# Patient Record
Sex: Female | Born: 1985
Health system: Southern US, Community
[De-identification: ages and names within clinical notes are randomized; demographics above are authoritative.]

## PROBLEM LIST (undated history)

## (undated) DIAGNOSIS — K449 Diaphragmatic hernia without obstruction or gangrene: Secondary | ICD-10-CM

## (undated) DIAGNOSIS — K219 Gastro-esophageal reflux disease without esophagitis: Secondary | ICD-10-CM

## (undated) DIAGNOSIS — Z87440 Personal history of urinary (tract) infections: Secondary | ICD-10-CM

## (undated) DIAGNOSIS — N858 Other specified noninflammatory disorders of uterus: Secondary | ICD-10-CM

## (undated) DIAGNOSIS — L309 Dermatitis, unspecified: Secondary | ICD-10-CM

## (undated) DIAGNOSIS — D649 Anemia, unspecified: Secondary | ICD-10-CM

## (undated) DIAGNOSIS — F329 Major depressive disorder, single episode, unspecified: Secondary | ICD-10-CM

## (undated) DIAGNOSIS — Z973 Presence of spectacles and contact lenses: Secondary | ICD-10-CM

## (undated) DIAGNOSIS — F32A Depression, unspecified: Secondary | ICD-10-CM

## (undated) DIAGNOSIS — F419 Anxiety disorder, unspecified: Secondary | ICD-10-CM

## (undated) DIAGNOSIS — F17200 Nicotine dependence, unspecified, uncomplicated: Secondary | ICD-10-CM

## (undated) DIAGNOSIS — G43909 Migraine, unspecified, not intractable, without status migrainosus: Secondary | ICD-10-CM

## (undated) HISTORY — DX: Nicotine dependence, unspecified, uncomplicated: F17.200

## (undated) HISTORY — DX: Major depressive disorder, single episode, unspecified: F32.9

## (undated) HISTORY — DX: Anxiety disorder, unspecified: F41.9

## (undated) HISTORY — DX: Personal history of urinary (tract) infections: Z87.440

## (undated) HISTORY — DX: Presence of spectacles and contact lenses: Z97.3

## (undated) HISTORY — DX: Gastro-esophageal reflux disease without esophagitis: K21.9

## (undated) HISTORY — PX: TUBAL LIGATION: SHX77

## (undated) HISTORY — DX: Depression, unspecified: F32.A

## (undated) HISTORY — DX: Dermatitis, unspecified: L30.9

---

## 2011-11-19 ENCOUNTER — Encounter (HOSPITAL_COMMUNITY): Payer: Self-pay | Admitting: Adult Health

## 2011-11-19 DIAGNOSIS — R51 Headache: Secondary | ICD-10-CM | POA: Insufficient documentation

## 2011-11-19 DIAGNOSIS — F17200 Nicotine dependence, unspecified, uncomplicated: Secondary | ICD-10-CM | POA: Insufficient documentation

## 2011-11-19 DIAGNOSIS — Z8719 Personal history of other diseases of the digestive system: Secondary | ICD-10-CM | POA: Insufficient documentation

## 2011-11-19 DIAGNOSIS — Z79899 Other long term (current) drug therapy: Secondary | ICD-10-CM | POA: Insufficient documentation

## 2011-11-19 NOTE — ED Notes (Addendum)
Presents with sudden onset of left sided temporal headache that began at 9 pm while at work pain is described as sharp, she laid down to sleep and woke up at 1015 and began vomiting, alert and oriented. Sensitive to light.  No neuro deficets.  No history of headaches or migraines. Took 3 ASA at work with no relief.

## 2011-11-20 ENCOUNTER — Emergency Department (HOSPITAL_COMMUNITY)
Admission: EM | Admit: 2011-11-20 | Discharge: 2011-11-20 | Disposition: A | Discharge status: Home / Self Care | Payer: Self-pay | Attending: Emergency Medicine | Admitting: Emergency Medicine

## 2011-11-20 ENCOUNTER — Emergency Department (HOSPITAL_COMMUNITY): Payer: Self-pay

## 2011-11-20 DIAGNOSIS — R51 Headache: Secondary | ICD-10-CM

## 2011-11-20 HISTORY — DX: Diaphragmatic hernia without obstruction or gangrene: K44.9

## 2011-11-20 LAB — POCT I-STAT, CHEM 8
Calcium, Ion: 1.25 mmol/L — ABNORMAL HIGH (ref 1.12–1.23)
Glucose, Bld: 92 mg/dL (ref 70–99)
HCT: 38 % (ref 36.0–46.0)
Hemoglobin: 12.9 g/dL (ref 12.0–15.0)
Potassium: 3.6 mEq/L (ref 3.5–5.1)

## 2011-11-20 LAB — CBC
HCT: 37.3 % (ref 36.0–46.0)
MCHC: 34.3 g/dL (ref 30.0–36.0)
MCV: 86.7 fL (ref 78.0–100.0)
RDW: 12.8 % (ref 11.5–15.5)

## 2011-11-20 MED ORDER — DEXAMETHASONE SODIUM PHOSPHATE 4 MG/ML IJ SOLN
10.0000 mg | Freq: Once | INTRAMUSCULAR | Status: AC
  Filled 2011-11-20: qty 1

## 2011-11-20 MED ORDER — DEXAMETHASONE SODIUM PHOSPHATE 10 MG/ML IJ SOLN
INTRAMUSCULAR | Status: AC
  Administered 2011-11-20: 10 mg
  Filled 2011-11-20: qty 1

## 2011-11-20 MED ORDER — METOCLOPRAMIDE HCL 5 MG/ML IJ SOLN
10.0000 mg | Freq: Once | INTRAMUSCULAR | Status: AC
  Administered 2011-11-20: 10 mg via INTRAVENOUS
  Filled 2011-11-20: qty 2

## 2011-11-20 MED ORDER — DIPHENHYDRAMINE HCL 50 MG/ML IJ SOLN
25.0000 mg | Freq: Once | INTRAMUSCULAR | Status: AC
  Administered 2011-11-20: 25 mg via INTRAVENOUS
  Filled 2011-11-20: qty 1

## 2011-11-20 MED ORDER — SODIUM CHLORIDE 0.9 % IV BOLUS (SEPSIS)
1000.0000 mL | Freq: Once | INTRAVENOUS | Status: AC
  Administered 2011-11-20: 1000 mL via INTRAVENOUS

## 2011-11-20 NOTE — ED Notes (Signed)
Patient currently in CT. Family at bedside.

## 2011-11-20 NOTE — ED Provider Notes (Signed)
History     CSN: [id number]  Arrival date & time 11/19/11  2229   First MD Initiated Contact with Patient 11/20/11 0029      Chief Complaint  Patient presents with  . Headache    (Consider location/radiation/quality/duration/timing/severity/associated sxs/prior treatment) HPI Hx per PT. At work tonight, had sudden onset severe L temporal HA< now throbbing in quality, light bothers her eyes. No h/o same, no F/C, no rash. No neck pain or stiffness, no syncope. Pain 10/10. No unilateral weakness or numbness. Took 3 ASA without relief. No known alleviating factors.  Past Medical History  Diagnosis Date  . Hiatal hernia     Past Surgical History  Procedure Date  . Tubal ligation     History reviewed. No pertinent family history.  History  Substance Use Topics  . Smoking status: Current Every Day Smoker  . Smokeless tobacco: Not on file  . Alcohol Use: Yes    OB History    Grav Para Term Preterm Abortions TAB SAB Ect Mult Living                  Review of Systems  Constitutional: Negative for fever and chills.  HENT: Negative for neck pain and neck stiffness.   Eyes: Negative for pain.  Respiratory: Negative for shortness of breath.   Cardiovascular: Negative for chest pain.  Gastrointestinal: Negative for abdominal pain.  Genitourinary: Negative for dysuria.  Musculoskeletal: Negative for back pain.  Skin: Negative for rash.  Neurological: Positive for headaches.  All other systems reviewed and are negative.    Allergies  Review of patient's allergies indicates no known allergies.  Home Medications   Current Outpatient Rx  Name  Route  Sig  Dispense  Refill  . VITAMIN-B COMPLEX PO   Oral   Take 1 tablet by mouth daily. Vitamin B5         . FISH OIL + D3 PO   Oral   Take by mouth.         . ADULT MULTIVITAMIN W/MINERALS CH   Oral   Take 1 tablet by mouth daily.           BP 120/76  Pulse 58  Temp 98.2 F (36.8 C) (Oral)  Resp 18  SpO2  100%  LMP 11/06/2011  Physical Exam  Constitutional: She is oriented to person, place, and time. She appears well-developed and well-nourished.  HENT:  Head: Normocephalic and atraumatic.  Eyes: Conjunctivae normal and EOM are normal. Pupils are equal, round, and reactive to light.  Neck: Full passive range of motion without pain. Neck supple. No thyromegaly present.       No meningismus  Cardiovascular: Normal rate, regular rhythm, S1 normal, S2 normal and intact distal pulses.   Pulmonary/Chest: Effort normal and breath sounds normal.  Abdominal: Soft. Bowel sounds are normal. There is no tenderness. There is no CVA tenderness.  Musculoskeletal: Normal range of motion.  Neurological: She is alert and oriented to person, place, and time. She has normal strength and normal reflexes. No cranial nerve deficit or sensory deficit. She displays a negative Romberg sign. GCS eye subscore is 4. GCS verbal subscore is 5. GCS motor subscore is 6.       Normal Gait  Skin: Skin is warm and dry. No rash noted. No cyanosis. Nails show no clubbing.  Psychiatric: She has a normal mood and affect. Her speech is normal and behavior is normal.    ED Course  LUMBAR PUNCTURE Date/Time:  11/20/2011 2:15 AM Performed by: Sunnie Nielsen Authorized by: Sunnie Nielsen Consent: Verbal consent obtained. Risks and benefits: risks, benefits and alternatives were discussed Consent given by: patient Patient understanding: patient states understanding of the procedure being performed Patient consent: the patient's understanding of the procedure matches consent given Procedure consent: procedure consent matches procedure scheduled Required items: required blood products, implants, devices, and special equipment available Patient identity confirmed: verbally with patient Time out: Immediately prior to procedure a "time out" was called to verify the correct patient, procedure, equipment, support staff and site/side marked as  required. Indications: evaluation for subarachnoid hemorrhage Anesthesia: local infiltration Local anesthetic: lidocaine 1% without epinephrine Anesthetic total: 2 ml Preparation: Patient was prepped and draped in the usual sterile fashion. Lumbar space: L4-L5 interspace Patient's position: sitting Needle gauge: 20 Needle type: spinal needle - Quincke tip Needle length: 2.5 in Post-procedure: adhesive bandage applied Comments: Attempted x 2 without success, PT insisted I stop and not proceed any further   (including critical care time)  Results for orders placed during the hospital encounter of 11/20/11  CBC      Component Value Range   WBC 8.0  4.0 - 10.5 K/uL   RBC 4.30  3.87 - 5.11 MIL/uL   Hemoglobin 12.8  12.0 - 15.0 g/dL   HCT 45.4  09.8 - 11.9 %   MCV 86.7  78.0 - 100.0 fL   MCH 29.8  26.0 - 34.0 pg   MCHC 34.3  30.0 - 36.0 g/dL   RDW 14.7  82.9 - 56.2 %   Platelets 209  150 - 400 K/uL  POCT I-STAT, CHEM 8      Component Value Range   Sodium 141  135 - 145 mEq/L   Potassium 3.6  3.5 - 5.1 mEq/L   Chloride 102  96 - 112 mEq/L   BUN 11  6 - 23 mg/dL   Creatinine, Ser 1.30  0.50 - 1.10 mg/dL   Glucose, Bld 92  70 - 99 mg/dL   Calcium, Ion 8.65 (*) 1.12 - 1.23 mmol/L   TCO2 25  0 - 100 mmol/L   Hemoglobin 12.9  12.0 - 15.0 g/dL   HCT 78.4  69.6 - 29.5 %   Ct Head Wo Contrast  11/20/2011  *RADIOLOGY REPORT*  Clinical Data: Headache.  Left-sided facial pain.  CT HEAD WITHOUT CONTRAST  Technique:  Contiguous axial images were obtained from the base of the skull through the vertex without contrast.  Comparison: None.  Findings: There is no evidence of acute infarction, mass lesion, or intra- or extra-axial hemorrhage on CT.  The posterior fossa, including the cerebellum, brainstem and fourth ventricle, is within normal limits.  The third and lateral ventricles, and basal ganglia are unremarkable in appearance.  The cerebral hemispheres are symmetric in appearance, with normal  gray- white differentiation.  No mass effect or midline shift is seen.  There is no evidence of fracture; visualized osseous structures are unremarkable in appearance.  The visualized portions of the orbits are within normal limits.  The paranasal sinuses and mastoid air cells are well-aerated.  No significant soft tissue abnormalities are seen.  IMPRESSION: Unremarkable noncontrast CT of the head.   Original Report Authenticated By: Tonia Ghent, M.D.    CT obtained and reviewed as above. IVFs and HA cocktail provided. Consented for LP to evaluate for occult bleed as above, PT uncomfortable and insisted I stop, declined any further attempts.   HA completely resolved, ambulates without neuro deficits.  AMA for HA possible SAH. Referral provided. We had a long discussion regarding possible Dx. Alternatives offer including LP under fluoro which she declines. PT states understanding risk of missed SAH, wishes to leave AMA, is A/O x 4. No indication for IVC.    MDM  HA with features concerning for possible SAH. CT. IVFs. HA cocktail. Attempted LP, left AMA.         Sunnie Nielsen, MD 11/20/11 2308

## 2011-11-20 NOTE — ED Notes (Signed)
Provider at bedside for procedure.

## 2012-01-14 HISTORY — PX: ESOPHAGOGASTRODUODENOSCOPY: SHX1529

## 2012-06-04 ENCOUNTER — Encounter (HOSPITAL_COMMUNITY): Payer: Self-pay | Admitting: Emergency Medicine

## 2012-06-04 DIAGNOSIS — R002 Palpitations: Secondary | ICD-10-CM | POA: Insufficient documentation

## 2012-06-04 DIAGNOSIS — Z8719 Personal history of other diseases of the digestive system: Secondary | ICD-10-CM | POA: Insufficient documentation

## 2012-06-04 DIAGNOSIS — F17200 Nicotine dependence, unspecified, uncomplicated: Secondary | ICD-10-CM | POA: Insufficient documentation

## 2012-06-04 DIAGNOSIS — Z79899 Other long term (current) drug therapy: Secondary | ICD-10-CM | POA: Insufficient documentation

## 2012-06-04 LAB — COMPREHENSIVE METABOLIC PANEL
AST: 14 U/L (ref 0–37)
Albumin: 4 g/dL (ref 3.5–5.2)
Alkaline Phosphatase: 43 U/L (ref 39–117)
Chloride: 101 mEq/L (ref 96–112)
Creatinine, Ser: 0.73 mg/dL (ref 0.50–1.10)
Potassium: 3.6 mEq/L (ref 3.5–5.1)
Total Bilirubin: 0.4 mg/dL (ref 0.3–1.2)

## 2012-06-04 LAB — CBC WITH DIFFERENTIAL/PLATELET
Basophils Absolute: 0 10*3/uL (ref 0.0–0.1)
Basophils Relative: 0 % (ref 0–1)
MCHC: 35.2 g/dL (ref 30.0–36.0)
Neutro Abs: 3.4 10*3/uL (ref 1.7–7.7)
Neutrophils Relative %: 49 % (ref 43–77)
RDW: 12.6 % (ref 11.5–15.5)

## 2012-06-04 NOTE — ED Notes (Signed)
PT. REPORTS INTERMITTENT PALPITATIONS FOR SEVERAL DAYS WORSE TODAY , DENIES CHEST PAIN , SLIGHT SOB , NO NAUSEA OR DIAPHORESIS , SLIGHT DIZZINESS.

## 2012-06-05 ENCOUNTER — Emergency Department (HOSPITAL_COMMUNITY)
Admission: EM | Admit: 2012-06-05 | Discharge: 2012-06-05 | Discharge status: Against Medical Advice | Payer: Self-pay | Attending: Emergency Medicine | Admitting: Emergency Medicine

## 2012-06-05 NOTE — ED Notes (Signed)
Unable to locate pt to take to treatment room.  Called pt's cellphone number and pt states she has already left and is at home in the bed.  Offered for pt to return to be seen and pt declines.

## 2012-08-24 ENCOUNTER — Encounter (HOSPITAL_COMMUNITY): Payer: Self-pay | Admitting: *Deleted

## 2012-08-24 ENCOUNTER — Emergency Department (HOSPITAL_COMMUNITY)
Admission: EM | Admit: 2012-08-24 | Discharge: 2012-08-24 | Disposition: A | Discharge status: Home / Self Care | Payer: BC Managed Care – PPO | Attending: Emergency Medicine | Admitting: Emergency Medicine

## 2012-08-24 ENCOUNTER — Emergency Department (HOSPITAL_COMMUNITY): Payer: BC Managed Care – PPO

## 2012-08-24 DIAGNOSIS — Z8719 Personal history of other diseases of the digestive system: Secondary | ICD-10-CM | POA: Insufficient documentation

## 2012-08-24 DIAGNOSIS — Z9851 Tubal ligation status: Secondary | ICD-10-CM | POA: Insufficient documentation

## 2012-08-24 DIAGNOSIS — R002 Palpitations: Secondary | ICD-10-CM | POA: Insufficient documentation

## 2012-08-24 DIAGNOSIS — Z79899 Other long term (current) drug therapy: Secondary | ICD-10-CM | POA: Insufficient documentation

## 2012-08-24 DIAGNOSIS — Z3202 Encounter for pregnancy test, result negative: Secondary | ICD-10-CM | POA: Insufficient documentation

## 2012-08-24 DIAGNOSIS — M549 Dorsalgia, unspecified: Secondary | ICD-10-CM | POA: Insufficient documentation

## 2012-08-24 DIAGNOSIS — F17200 Nicotine dependence, unspecified, uncomplicated: Secondary | ICD-10-CM | POA: Insufficient documentation

## 2012-08-24 LAB — URINALYSIS, ROUTINE W REFLEX MICROSCOPIC
Glucose, UA: NEGATIVE mg/dL
Protein, ur: NEGATIVE mg/dL

## 2012-08-24 LAB — COMPREHENSIVE METABOLIC PANEL
BUN: 13 mg/dL (ref 6–23)
CO2: 27 mEq/L (ref 19–32)
Calcium: 9.7 mg/dL (ref 8.4–10.5)
Creatinine, Ser: 0.7 mg/dL (ref 0.50–1.10)
GFR calc Af Amer: >90 mL/min (ref >90)
GFR calc non Af Amer: >90 mL/min (ref >90)
Glucose, Bld: 93 mg/dL (ref 70–99)
Total Bilirubin: 0.1 mg/dL — ABNORMAL LOW (ref 0.3–1.2)

## 2012-08-24 LAB — CBC WITH DIFFERENTIAL/PLATELET
Basophils Absolute: 0 10*3/uL (ref 0.0–0.1)
Eosinophils Relative: 1 % (ref 0–5)
HCT: 40.2 % (ref 36.0–46.0)
Hemoglobin: 13.8 g/dL (ref 12.0–15.0)
Lymphocytes Relative: 37 % (ref 12–46)
Lymphs Abs: 2.9 10*3/uL (ref 0.7–4.0)
MCV: 86.8 fL (ref 78.0–100.0)
Monocytes Absolute: 0.6 10*3/uL (ref 0.1–1.0)
Monocytes Relative: 8 % (ref 3–12)
RDW: 12.6 % (ref 11.5–15.5)
WBC: 7.8 10*3/uL (ref 4.0–10.5)

## 2012-08-24 LAB — URINE MICROSCOPIC-ADD ON

## 2012-08-24 LAB — PREGNANCY, URINE: Preg Test, Ur: NEGATIVE

## 2012-08-24 MED ORDER — TRAMADOL HCL 50 MG PO TABS: 50.0000 mg | ORAL_TABLET | Freq: Four times a day (QID) | ORAL | Status: DC | PRN

## 2012-08-24 MED ORDER — CEPHALEXIN 500 MG PO CAPS: 500.0000 mg | ORAL_CAPSULE | Freq: Four times a day (QID) | ORAL | Status: DC

## 2012-08-24 NOTE — ED Provider Notes (Signed)
CSN: [id number]     Arrival date & time 08/24/12  1948 History     First MD Initiated Contact with Patient 08/24/12 2024     Chief Complaint  Patient presents with  . Palpitations  . Back Pain   (Consider location/radiation/quality/duration/timing/severity/associated sxs/prior Treatment) Patient is a 27 y.o. female presenting with palpitations and back pain. The history is provided by the patient (the pt states that she has back pain and some palpitions).  Palpitations Palpitations quality:  Irregular Onset quality:  Sudden Timing:  Intermittent Progression:  Unchanged Chronicity:  New Context: not caffeine   Associated symptoms: back pain   Associated symptoms: no chest pain and no cough   Back Pain Associated symptoms: no abdominal pain, no chest pain and no headaches     Past Medical History  Diagnosis Date  . Hiatal hernia    Past Surgical History  Procedure Laterality Date  . Tubal ligation     History reviewed. No pertinent family history. History  Substance Use Topics  . Smoking status: Current Every Day Smoker -- 0.50 packs/day    Types: Cigarettes  . Smokeless tobacco: Not on file  . Alcohol Use: Yes     Comment: socially   OB History   Grav Para Term Preterm Abortions TAB SAB Ect Mult Living                 Review of Systems  Constitutional: Negative for appetite change and fatigue.  HENT: Negative for congestion, sinus pressure and ear discharge.   Eyes: Negative for discharge.  Respiratory: Negative for cough.   Cardiovascular: Positive for palpitations. Negative for chest pain.  Gastrointestinal: Negative for abdominal pain and diarrhea.  Genitourinary: Negative for frequency and hematuria.  Musculoskeletal: Positive for back pain.  Skin: Negative for rash.  Neurological: Negative for seizures and headaches.  Psychiatric/Behavioral: Negative for hallucinations.    Allergies  Review of patient's allergies indicates no known allergies.  Home  Medications   Current Outpatient Rx  Name  Route  Sig  Dispense  Refill  . BIOTIN PO   Oral   Take 1 tablet by mouth daily.          . Multiple Vitamin (MULTIVITAMIN WITH MINERALS) TABS   Oral   Take 1 tablet by mouth daily.         . cephALEXin (KEFLEX) 500 MG capsule   Oral   Take 1 capsule (500 mg total) by mouth 4 (four) times daily.   28 capsule   0   . traMADol (ULTRAM) 50 MG tablet   Oral   Take 1 tablet (50 mg total) by mouth every 6 (six) hours as needed for pain.   15 tablet   0    BP 119/77  Pulse 87  Temp(Src) 98.5 F (36.9 C) (Oral)  Resp 18  Ht 5\' 7"  (1.702 m)  Wt 150 lb 10.1 oz (68.325 kg)  BMI 23.59 kg/m2  SpO2 99%  LMP 08/20/2012 Physical Exam  Constitutional: She is oriented to person, place, and time. She appears well-developed.  HENT:  Head: Normocephalic.  Eyes: Conjunctivae and EOM are normal. No scleral icterus.  Neck: Neck supple. No thyromegaly present.  Cardiovascular: Normal rate.  Exam reveals no gallop and no friction rub.   No murmur heard. Irregular heart rate  Pulmonary/Chest: No stridor. She has no wheezes. She has no rales. She exhibits no tenderness.  Abdominal: She exhibits no distension. There is no tenderness. There is no rebound.  Genitourinary:  Mild tender right flank  Musculoskeletal: Normal range of motion. She exhibits no edema.  Lymphadenopathy:    She has no cervical adenopathy.  Neurological: She is oriented to person, place, and time. Coordination normal.  Skin: No rash noted. No erythema.  Psychiatric: She has a normal mood and affect. Her behavior is normal.    ED Course   Procedures (including critical care time)  Labs Reviewed  URINALYSIS, ROUTINE W REFLEX MICROSCOPIC - Abnormal; Notable for the following:    APPearance TURBID (*)    Hgb urine dipstick LARGE (*)    Leukocytes, UA SMALL (*)    All other components within normal limits  COMPREHENSIVE METABOLIC PANEL - Abnormal; Notable for the  following:    Total Bilirubin 0.1 (*)    All other components within normal limits  URINE CULTURE  CBC WITH DIFFERENTIAL  PREGNANCY, URINE  URINE MICROSCOPIC-ADD ON   Dg Abd Acute W/chest  08/24/2012   *RADIOLOGY REPORT*  Clinical Data: Abdominal pain.  Nausea.  ACUTE ABDOMEN SERIES (ABDOMEN 2 VIEW & CHEST 1 VIEW)  Comparison: No priors.  Findings: Lung volumes are normal.  No consolidative airspace disease.  No pleural effusions.  No pneumothorax.  No pulmonary nodule or mass noted.  Pulmonary vasculature and the cardiomediastinal silhouette are within normal limits.  Gas and stool are seen scattered throughout the colon extending to the level of the distal rectum.  No pathologic distension of small bowel is noted.  No gross evidence of pneumoperitoneum.  Phlebolith in the left hemi pelvis incidentally noted.  IMPRESSION: 1.  Nonobstructive bowel gas pattern. 2.  No pneumoperitoneum. 3.  No radiographic evidence of acute cardiopulmonary disease.   Original Report Authenticated By: Trudie Reed, M.D.   1. Back pain    Date: 08/24/2012  Rate65  Rhythm: normal sinus rhythm  With pac  QRS Axis: normal  Intervals: normal  ST/T Wave abnormalities: normal  Conduction Disutrbances:none  Narrative Interpretation:   Old EKG Reviewed: none available    MDM    Benny Lennert, MD 08/24/12 2255

## 2012-08-24 NOTE — ED Notes (Signed)
Pt states that she has had heart palpitations off and on x 1 month; pt states that it feels like it flutters and then skips a beat from time to time; pt denies chest pain; pt states that she quit taking the Depo shot in May and got her period last month and has had vaginal spotting for the last 4-5 weeks; pt reports that 2 days ago she began to lower back pain worse on the rt side that has progressively gotten worse; pt also c/o nausea with no vomiting; pt denies urinary frequency or urgency

## 2012-08-26 LAB — URINE CULTURE: Colony Count: 30000

## 2012-09-08 ENCOUNTER — Emergency Department (HOSPITAL_COMMUNITY)
Admission: EM | Admit: 2012-09-08 | Discharge: 2012-09-08 | Disposition: A | Discharge status: Home / Self Care | Payer: BC Managed Care – PPO | Attending: Emergency Medicine | Admitting: Emergency Medicine

## 2012-09-08 ENCOUNTER — Encounter (HOSPITAL_COMMUNITY): Payer: Self-pay | Admitting: Emergency Medicine

## 2012-09-08 DIAGNOSIS — F17200 Nicotine dependence, unspecified, uncomplicated: Secondary | ICD-10-CM | POA: Insufficient documentation

## 2012-09-08 DIAGNOSIS — G43909 Migraine, unspecified, not intractable, without status migrainosus: Secondary | ICD-10-CM | POA: Insufficient documentation

## 2012-09-08 DIAGNOSIS — Z8719 Personal history of other diseases of the digestive system: Secondary | ICD-10-CM | POA: Insufficient documentation

## 2012-09-08 DIAGNOSIS — R112 Nausea with vomiting, unspecified: Secondary | ICD-10-CM | POA: Insufficient documentation

## 2012-09-08 DIAGNOSIS — H53149 Visual discomfort, unspecified: Secondary | ICD-10-CM | POA: Insufficient documentation

## 2012-09-08 DIAGNOSIS — R519 Headache, unspecified: Secondary | ICD-10-CM

## 2012-09-08 DIAGNOSIS — R6883 Chills (without fever): Secondary | ICD-10-CM | POA: Insufficient documentation

## 2012-09-08 LAB — CBC WITH DIFFERENTIAL/PLATELET
Basophils Absolute: 0 10*3/uL (ref 0.0–0.1)
Basophils Relative: 0 % (ref 0–1)
Eosinophils Absolute: 0 10*3/uL (ref 0.0–0.7)
Eosinophils Relative: 1 % (ref 0–5)
HCT: 38.9 % (ref 36.0–46.0)
Hemoglobin: 13.6 g/dL (ref 12.0–15.0)
Lymphocytes Relative: 24 % (ref 12–46)
Lymphs Abs: 1.8 10*3/uL (ref 0.7–4.0)
MCH: 29.8 pg (ref 26.0–34.0)
MCHC: 35 g/dL (ref 30.0–36.0)
MCV: 85.3 fL (ref 78.0–100.0)
Monocytes Absolute: 0.6 10*3/uL (ref 0.1–1.0)
Monocytes Relative: 7 % (ref 3–12)
Neutro Abs: 5.1 10*3/uL (ref 1.7–7.7)
Neutrophils Relative %: 68 % (ref 43–77)
Platelets: 219 10*3/uL (ref 150–400)
RBC: 4.56 MIL/uL (ref 3.87–5.11)
RDW: 12.5 % (ref 11.5–15.5)
WBC: 7.5 10*3/uL (ref 4.0–10.5)

## 2012-09-08 LAB — COMPREHENSIVE METABOLIC PANEL
ALT: 9 U/L (ref 0–35)
AST: 14 U/L (ref 0–37)
Albumin: 3.7 g/dL (ref 3.5–5.2)
Alkaline Phosphatase: 43 U/L (ref 39–117)
BUN: 6 mg/dL (ref 6–23)
CO2: 26 mEq/L (ref 19–32)
Calcium: 9 mg/dL (ref 8.4–10.5)
Chloride: 100 mEq/L (ref 96–112)
Creatinine, Ser: 0.68 mg/dL (ref 0.50–1.10)
GFR calc Af Amer: >90 mL/min (ref >90)
GFR calc non Af Amer: >90 mL/min (ref >90)
Glucose, Bld: 93 mg/dL (ref 70–99)
Potassium: 3.4 mEq/L — ABNORMAL LOW (ref 3.5–5.1)
Sodium: 135 mEq/L (ref 135–145)
Total Bilirubin: 0.5 mg/dL (ref 0.3–1.2)
Total Protein: 6.9 g/dL (ref 6.0–8.3)

## 2012-09-08 MED ORDER — KETOROLAC TROMETHAMINE 30 MG/ML IJ SOLN
30.0000 mg | Freq: Once | INTRAMUSCULAR | Status: AC
  Administered 2012-09-08: 30 mg via INTRAVENOUS
  Filled 2012-09-08: qty 1

## 2012-09-08 MED ORDER — DEXAMETHASONE SODIUM PHOSPHATE 10 MG/ML IJ SOLN
10.0000 mg | Freq: Once | INTRAMUSCULAR | Status: AC
  Administered 2012-09-08: 10 mg via INTRAVENOUS
  Filled 2012-09-08: qty 1

## 2012-09-08 MED ORDER — ONDANSETRON HCL 4 MG/2ML IJ SOLN
4.0000 mg | Freq: Once | INTRAMUSCULAR | Status: AC
  Administered 2012-09-08: 4 mg via INTRAVENOUS
  Filled 2012-09-08: qty 2

## 2012-09-08 MED ORDER — DIPHENHYDRAMINE HCL 50 MG/ML IJ SOLN
25.0000 mg | Freq: Once | INTRAMUSCULAR | Status: AC
  Administered 2012-09-08: 25 mg via INTRAVENOUS
  Filled 2012-09-08: qty 1

## 2012-09-08 MED ORDER — METOCLOPRAMIDE HCL 5 MG/ML IJ SOLN
10.0000 mg | Freq: Once | INTRAMUSCULAR | Status: AC
  Administered 2012-09-08: 10 mg via INTRAVENOUS
  Filled 2012-09-08: qty 2

## 2012-09-08 MED ORDER — SODIUM CHLORIDE 0.9 % IV BOLUS (SEPSIS)
1000.0000 mL | Freq: Once | INTRAVENOUS | Status: AC
  Administered 2012-09-08: 1000 mL via INTRAVENOUS

## 2012-09-08 NOTE — ED Provider Notes (Signed)
CSN: [id number]     Arrival date & time 09/08/12  1240 History   First MD Initiated Contact with Patient 09/08/12 1320     Chief Complaint  Patient presents with  . Migraine   (Consider location/radiation/quality/duration/timing/severity/associated sxs/prior Treatment) HPI Comments: 27 year old female presents to the emergency department complaining of gradual onset intermittent headache over the past week. Headache located around her temporal region bilateral, described as throbbing rated 8/10. She has not had any alleviating factors for headache. 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 This morning she woke up with chills and became nauseous and vomited. Admits to associated photophobia. Denies visual disturbance, fever, abdominal pain, neck pain, rash. States this feels similar to when she was treated for a migraine a few months back. At that time she was advised to followup with PCP, however has not done so. Does not have a PCP.  Patient is a 27 y.o. female presenting with migraines. The history is provided by the patient.  Migraine Associated symptoms include chills, headaches, nausea and vomiting. Pertinent negatives include no abdominal pain, fever, neck pain, rash or weakness.    Past Medical History  Diagnosis Date  . Hiatal hernia    Past Surgical History  Procedure Laterality Date  . Tubal ligation     History reviewed. No pertinent family history. History  Substance Use Topics  . Smoking status: Current Every Day Smoker -- 0.50 packs/day    Types: Cigarettes  . Smokeless tobacco: Not on file  . Alcohol Use: Yes     Comment: socially   OB History   Grav Para Term Preterm Abortions TAB SAB Ect Mult Living                 Review of Systems  Constitutional: Positive for chills. Negative for fever and activity change.  HENT: Negative for neck pain and neck stiffness.   Eyes: Positive for  photophobia. Negative for visual disturbance.  Gastrointestinal: Positive for nausea and vomiting. Negative for abdominal pain.  Musculoskeletal: Negative for back pain.  Skin: Negative for rash.  Neurological: Positive for headaches. Negative for dizziness, syncope, weakness and light-headedness.  Psychiatric/Behavioral: Negative for confusion.  All other systems reviewed and are negative.    Allergies  Review of patient's allergies indicates no known allergies.  Home Medications   Current Outpatient Rx  Name  Route  Sig  Dispense  Refill  . cephALEXin (KEFLEX) 500 MG capsule   Oral   Take 1 capsule (500 mg total) by mouth 4 (four) times daily.   28 capsule   0   . traMADol (ULTRAM) 50 MG tablet   Oral   Take 1 tablet (50 mg total) by mouth every 6 (six) hours as needed for pain.   15 tablet   0    BP 125/72  Pulse 89  Temp(Src) 99.3 F (37.4 C) (Oral)  Resp 20  SpO2 100%  LMP 08/20/2012 Physical Exam  Nursing note and vitals reviewed. Constitutional: She is oriented to person, place, and time. She appears well-developed and well-nourished. No distress.  HENT:  Head: Normocephalic and atraumatic.  Mouth/Throat: Oropharynx is clear and moist.  Eyes: Conjunctivae and EOM are normal. Pupils are equal, round, and reactive to light.  Neck: Normal range of motion. Neck supple.  Cardiovascular: Normal rate, regular rhythm and normal heart sounds.   Pulmonary/Chest: Effort normal and breath sounds normal.  Abdominal: Soft.  Bowel sounds are normal. There is no tenderness.  Musculoskeletal: Normal range of motion. She exhibits no edema.  Neurological: She is alert and oriented to person, place, and time. She has normal strength. No cranial nerve deficit or sensory deficit. She displays a negative Romberg sign. Coordination and gait normal.  Skin: Skin is warm and dry. She is not diaphoretic.  Psychiatric: She has a normal mood and affect. Her behavior is normal.    ED  Course  Procedures (including critical care time) Labs Review Labs Reviewed  CBC WITH DIFFERENTIAL  COMPREHENSIVE METABOLIC PANEL   Imaging Review No results found.  MDM   1. Headache   2. Migraine      Patient with headache similar to prior migraines she has had in the past. Labs obtained in triage prior to patient being seen- cbc, cmp. No sudden onset. No red flags concerning patient's headache. Unremarkable neurologic exam, no leukocytosis, NAD. Headache completely improved after receiving IV fluids, Toradol, Decadron, Reglan and Benadryl. She did not followup with her primary care physician as it is in the past, resources given for PCP followup. Return precautions discussed. Patient states understanding of plan and is agreeable.   Trevor Mace, PA-C 09/08/12 1535

## 2012-09-08 NOTE — ED Notes (Signed)
Pt arrived to the ED with a complaint of a headache that has lasted for a week.  Pt states that she woke up today with chills.  Pt was seen at [facility] couple" of months ago for same, given a migraine cocktail and told to follow up with PCP

## 2012-09-08 NOTE — ED Notes (Signed)
Pt not in lobby. Called x 1

## 2012-09-08 NOTE — ED Notes (Signed)
Pt reports having headache x 1 week, today much worse, also today nausea and vomiting. Reports photophobia.

## 2012-09-08 NOTE — ED Notes (Signed)
Update - pt  C/o vomiting

## 2012-09-09 NOTE — ED Provider Notes (Signed)
Medical screening examination/treatment/procedure(s) were performed by non-physician practitioner and as supervising physician I was immediately available for consultation/collaboration.  Raeford Razor, MD 09/09/12 1025

## 2012-09-20 ENCOUNTER — Encounter: Payer: Self-pay | Admitting: Medical

## 2012-09-20 ENCOUNTER — Ambulatory Visit (INDEPENDENT_AMBULATORY_CARE_PROVIDER_SITE_OTHER): Payer: BC Managed Care – PPO | Admitting: Medical

## 2012-09-20 VITALS — BP 100/68 | HR 68 | Temp 98.0°F | Resp 16 | Wt 150.0 lb

## 2012-09-20 DIAGNOSIS — R110 Nausea: Secondary | ICD-10-CM

## 2012-09-20 DIAGNOSIS — K219 Gastro-esophageal reflux disease without esophagitis: Secondary | ICD-10-CM

## 2012-09-20 DIAGNOSIS — R630 Anorexia: Secondary | ICD-10-CM

## 2012-09-20 DIAGNOSIS — K449 Diaphragmatic hernia without obstruction or gangrene: Secondary | ICD-10-CM

## 2012-09-20 DIAGNOSIS — R519 Headache, unspecified: Secondary | ICD-10-CM

## 2012-09-20 DIAGNOSIS — R634 Abnormal weight loss: Secondary | ICD-10-CM

## 2012-09-20 DIAGNOSIS — R51 Headache: Secondary | ICD-10-CM

## 2012-09-20 LAB — POCT URINALYSIS DIPSTICK
Bilirubin, UA: NEGATIVE
Leukocytes, UA: NEGATIVE
Nitrite, UA: NEGATIVE
pH, UA: 5

## 2012-09-20 MED ORDER — ONDANSETRON HCL 4 MG PO TABS: 4.0000 mg | ORAL_TABLET | Freq: Three times a day (TID) | ORAL | Status: DC | PRN

## 2012-09-20 NOTE — Progress Notes (Signed)
Subjective:  Patricia Hayes is a 27 y.o. female who presents as a new patient.    Went to the hospital few weeks ago to hospital for back pain and had blood in urine.  Was told to f/u on blood in urine.   She notes hx/o back pains since age 89yo.  Currently she is on her period.  Currently is not having back pain.   Currently no UTI symptoms.   Been having lots of problems with migraines, having to leave work.  She notes hx/o headaches.  Current headaches worsened about 2 months ago. Rapidity of onset was gradual. The patient gets headaches frequently, at least once weekly with bad headache.  The headache is described as throbbing and is bilateral, temporal in location.  The patient rates the pain as moderate intensity.  Precipitating factors include none which have been determined. The headache was not preceded by an aura. Associated neurologic symptoms which are present include photophobia, nausa, phonophobia. Other associated symptoms include nothing pertinent.  She does note hx/o depression, mood at times is down.  Sleep is so so.   She does have some added relationship/family stress.  Never has had a good home life.  Other history includes: headaches of unknown type diagnosed in the past.  Prior chronic headache treatment includes: OTC analgesics and headache cocktail at the emergency dept, recently twice in the last 2 months.   Has hx/o hiatal hernia.  Lately having problems holding food down, no appetite.  In the past this has always been associated with GERD symptoms.  She has used GERD medication prior with flare up but currently not taking GERD medications.  She notes hx/o EGD at age 61yo.    Has lost 20 lb in last 2 months.   Had physical in march of this year, and no major issues identified.  Last pap was normal in March.  She is not sure why the weight loss, but does agree her mood has been down.  No other c/o.  The following portions of the patient's history were reviewed and updated as  appropriate: allergies, current medications, past family history, past medical history, past social history, past surgical history and problem list.  ROS Otherwise as in subjective above  Objective: Physical Exam  Vital signs reviewed  General appearance: alert, no distress, WD/WN, lean AA female HEENT: normocephalic, sclerae anicteric, conjunctiva pink and moist, TMs pearly, nares patent, no discharge or erythema, pharynx normal Oral cavity: MMM, no lesions Neck: supple, no lymphadenopathy, no thyromegaly, no masses, on bruits Heart: RRR, normal S1, S2, no murmurs Lungs: CTA bilaterally, no wheezes, rhonchi, or rales Abdomen: +bs, soft, mild suprapubic and LLQ tenderness, non distended, no masses, no hepatomegaly, no splenomegaly Pulses: 2+ radial pulses, 2+ pedal pulses, normal cap refill Ext: no edema Neuro: CN2-12 intact, nonfocal exam   Assessment: Encounter Diagnoses  Name Primary?  . Chronic headache Yes  . Hiatal hernia   . GERD (gastroesophageal reflux disease)   . Loss of weight   . Nausea alone   . Anorexia     Plan: Chronic headache - discussed symptoms, concerns.   Advised she see her counselor and psychiatrist soon regarding mood and stress.  Begin samples of Relpax for acute headache.  discussed risk/benefits.  Recheck 2wk. Hiatal hernia/gerd - begin samples of Dexilant, avoid trigger avoidance Loss of weight - reviewed recent labs per the ED.  Weight loss may be due both to nausea and lack of appetite from hiatal hernia as well as  depressed mood.  Recheck 2wk. Nausea - zofran prescribed for prn use Anorexia - plan as above Follow up: 2wk, sooner prn.

## 2012-09-20 NOTE — Patient Instructions (Addendum)
For nausea and decrease appetite, history of hiatal hernia, begin Dexilant samples, once capsule in the morning, preferably 30-45 minutes prior to breakfast.  Avoid acidic foods, fast food, and big portions.  For acute onset of bad headache or migraine, begin Relpax sample.  If the headache is not improved in 2 house, take a second Relpax.   Dont' take any more than 2 daily.  If needed, take Zofran for nausea.   I sent this to the pharmacy.  Try to get 7-8 hours of sleep nightly.   Try and reduce stress where possible. Consider going back to your counselor or psychiatry to discuss mood issues.  Lets plan to follow up in 2 weeks or sooner as needed.   Migraine Headache A migraine headache is an intense, throbbing pain on one or both sides of your head. A migraine can last for 30 minutes to several hours. CAUSES  The exact cause of a migraine headache is not always known. However, a migraine may be caused when nerves in the brain become irritated and release chemicals that cause inflammation. This causes pain. SYMPTOMS  Pain on one or both sides of your head.  Pulsating or throbbing pain.  Severe pain that prevents daily activities.  Pain that is aggravated by any physical activity.  Nausea, vomiting, or both.  Dizziness.  Pain with exposure to bright lights, loud noises, or activity.  General sensitivity to bright lights, loud noises, or smells. Before you get a migraine, you may get warning signs that a migraine is coming (aura). An aura may include:  Seeing flashing lights.  Seeing bright spots, halos, or zig-zag lines.  Having tunnel vision or blurred vision.  Having feelings of numbness or tingling.  Having trouble talking.  Having muscle weakness. MIGRAINE TRIGGERS  Alcohol.  Smoking.  Stress.  Menstruation.  Aged cheeses.  Foods or drinks that contain nitrates, glutamate, aspartame, or tyramine.  Lack of  sleep.  Chocolate.  Caffeine.  Hunger.  Physical exertion.  Fatigue.  Medicines used to treat chest pain (nitroglycerine), birth control pills, estrogen, and some blood pressure medicines. DIAGNOSIS  A migraine headache is often diagnosed based on:  Symptoms.  Physical examination.  A CT scan or MRI of your head. TREATMENT Medicines may be given for pain and nausea. Medicines can also be given to help prevent recurrent migraines.  HOME CARE INSTRUCTIONS  Only take over-the-counter or prescription medicines for pain or discomfort as directed by your caregiver. The use of long-term narcotics is not recommended.  Lie down in a dark, quiet room when you have a migraine.  Keep a journal to find out what may trigger your migraine headaches. For example, write down:  What you eat and drink.  How much sleep you get.  Any change to your diet or medicines.  Limit alcohol consumption.  Quit smoking if you smoke.  Get 7 to 9 hours of sleep, or as recommended by your caregiver.  Limit stress.  Keep lights dim if bright lights bother you and make your migraines worse. SEEK IMMEDIATE MEDICAL CARE IF:   Your migraine becomes severe.  You have a fever.  You have a stiff neck.  You have vision loss.  You have muscular weakness or loss of muscle control.  You start losing your balance or have trouble walking.  You feel faint or pass out.  You have severe symptoms that are different from your first symptoms. MAKE SURE YOU:   Understand these instructions.  Will watch your  condition.  Will get help right away if you are not doing well or get worse. Document Released: 12/30/2004 Document Revised: 03/24/2011 Document Reviewed: 12/20/2010 [facility] Patient Information 2014 [place], [place].    Hiatal Hernia A hiatal hernia occurs when a part of the stomach slides above the diaphragm. The diaphragm is the thin muscle separating the belly (abdomen) from the chest. A  hiatal hernia can be something you are born with or develop over time. Hiatal hernias may allow stomach acid to flow back into your esophagus, the tube which carries food from your mouth to your stomach. If this acid causes problems it is called GERD (gastro-esophageal reflux disease).  SYMPTOMS  Common symptoms of GERD are heartburn (burning in your chest). This is worse when lying down or bending over. It may also cause belching and indigestion. Some of the things which make GERD worse are:  Increased weight pushes on stomach making acid rise more easily.  Smoking markedly increases acid production.  Alcohol decreases lower esophageal sphincter pressure (valve between stomach and esophagus), allowing acid from stomach into esophagus.  Late evening meals and going to bed with a full stomach increases pressure.  Anything that causes an increase in acid production.  Lower esophageal sphincter incompetence. DIAGNOSIS  Hiatal hernia is often diagnosed with x-rays of your stomach and small bowel. This is called an UGI (upper gastrointestinal x-ray). Sometimes a gastroscopic procedure is done. This is a procedure where your caregiver uses a flexible instrument to look into the stomach and small bowel. HOME CARE INSTRUCTIONS   Try to achieve and maintain an ideal body weight.  Avoid drinking alcoholic beverages.  Stop smoking.  Put the head of your bed on 4 to 6 inch blocks. This will keep your head and esophagus higher than your stomach. If you cannot use blocks, sleep with several pillows under your head and shoulders.  Over-the-counter medications will decrease acid production. Your caregiver can also prescribe medications for this. Take as directed.  1/2 to 1 teaspoon of an antacid taken every hour while awake, with meals and at bedtime, will neutralize acid.  Do not take aspirin, ibuprofen (Advil or Motrin), or other nonsteroidal anti-inflammatory drugs.  Do not wear tight clothing  around your chest or stomach.  Eat smaller meals and eat more frequently. This keeps your stomach from getting too full. Eat slowly.  Do not lie down for 2 or 3 hours after eating. Do not eat or drink anything 1 to 2 hours before going to bed.  Avoid caffeine beverages (colas, coffee, cocoa, tea), fatty foods, citrus fruits and all other foods and drinks that contain acid and that seem to increase the problems.  Avoid bending over, especially after eating. Also avoid straining during bowel movements or when urinating or lifting things. Anything that increases the pressure in your belly increases the amount of acid that may be pushed up into your esophagus. SEEK IMMEDIATE MEDICAL CARE IF:  There is change in location (pain in arms, neck, jaw, teeth or back) of your pain, or the pain is getting worse.  You also experience nausea, vomiting, sweating (diaphoresis), or shortness of breath.  You develop continual vomiting, vomit blood or coffee ground material, have bright red blood in your stools, or have black tarry stools. Some of these symptoms could signal other problems such as heart disease. MAKE SURE YOU:   Understand these instructions.  Monitor your condition.  Contact your caregiver if you are not doing well or are getting worse.  Document Released: 03/22/2003 Document Revised: 03/24/2011 Document Reviewed: 12/30/2004 [facility] Patient Information 2014 [place], [place].

## 2012-10-01 ENCOUNTER — Telehealth: Payer: Self-pay | Admitting: Medical

## 2012-10-01 NOTE — Telephone Encounter (Signed)
I think it'll be okay to wait till Monday

## 2012-10-01 NOTE — Telephone Encounter (Signed)
PT INFORMED AND SHE VERBALIZED UNDERSTANDING

## 2012-10-04 ENCOUNTER — Ambulatory Visit: Payer: BC Managed Care – PPO | Admitting: Medical

## 2012-10-15 ENCOUNTER — Ambulatory Visit: Payer: BC Managed Care – PPO | Admitting: Medical

## 2012-10-18 ENCOUNTER — Ambulatory Visit (INDEPENDENT_AMBULATORY_CARE_PROVIDER_SITE_OTHER): Payer: BC Managed Care – PPO | Admitting: Medical

## 2012-10-18 ENCOUNTER — Encounter: Payer: Self-pay | Admitting: Medical

## 2012-10-18 VITALS — BP 100/70 | HR 80 | Temp 98.3°F | Resp 16 | Wt 147.0 lb

## 2012-10-18 DIAGNOSIS — R519 Headache, unspecified: Secondary | ICD-10-CM

## 2012-10-18 DIAGNOSIS — F32A Depression, unspecified: Secondary | ICD-10-CM

## 2012-10-18 DIAGNOSIS — N926 Irregular menstruation, unspecified: Secondary | ICD-10-CM

## 2012-10-18 DIAGNOSIS — L0293 Carbuncle, unspecified: Secondary | ICD-10-CM

## 2012-10-18 DIAGNOSIS — F329 Major depressive disorder, single episode, unspecified: Secondary | ICD-10-CM

## 2012-10-18 DIAGNOSIS — R3129 Other microscopic hematuria: Secondary | ICD-10-CM

## 2012-10-18 DIAGNOSIS — R51 Headache: Secondary | ICD-10-CM

## 2012-10-18 DIAGNOSIS — R634 Abnormal weight loss: Secondary | ICD-10-CM

## 2012-10-18 DIAGNOSIS — F3289 Other specified depressive episodes: Secondary | ICD-10-CM

## 2012-10-18 DIAGNOSIS — K449 Diaphragmatic hernia without obstruction or gangrene: Secondary | ICD-10-CM

## 2012-10-18 DIAGNOSIS — L0292 Furuncle, unspecified: Secondary | ICD-10-CM

## 2012-10-18 DIAGNOSIS — R110 Nausea: Secondary | ICD-10-CM

## 2012-10-18 LAB — POCT URINE PREGNANCY: Preg Test, Ur: NEGATIVE

## 2012-10-18 MED ORDER — DOXYCYCLINE HYCLATE 100 MG PO TABS: 100.0000 mg | ORAL_TABLET | Freq: Two times a day (BID) | ORAL | Status: DC

## 2012-10-18 MED ORDER — ELETRIPTAN HYDROBROMIDE 40 MG PO TABS: 40.0000 mg | ORAL_TABLET | ORAL | Status: DC | PRN

## 2012-10-18 MED ORDER — PAROXETINE HCL 20 MG PO TABS: ORAL_TABLET | ORAL | Status: DC

## 2012-10-18 MED ORDER — DEXLANSOPRAZOLE 60 MG PO CPDR: 60.0000 mg | DELAYED_RELEASE_CAPSULE | Freq: Every day | ORAL | Status: DC

## 2012-10-18 NOTE — Progress Notes (Signed)
Subjective: Here for c/o boils.  Has current boil left inner thigh since Friday, was worse Friday, but gotten a little better. Has never had to have I&D.  Has never been put on antibiotics for this.   Current boils is tender, was red and swollen but improved.  Has had them break open in the past.  Been getting them once every 1-2 months, sometimes monthly.  In the past has gotten them under arms.  Denies fever, NV.  Here for f/u on blood in urine, but period went off and restarted 2 days ago.  Denies visible blood or current back pain or abdominal pain.  Been having lots of problems with migraines, having to leave work.  She notes hx/o headaches.  Current headaches worsened about 2 months ago. Rapidity of onset was gradual. The patient gets headaches frequently, at least once weekly with bad headache.  The headache is described as throbbing and is bilateral, temporal in location.  The patient rates the pain as moderate intensity.  Precipitating factors include none which have been determined. The headache was not preceded by an aura. Associated neurologic symptoms which are present include photophobia, nausa, phonophobia. Other associated symptoms include nothing pertinent.  She does note hx/o depression, mood at times is down.  Sleep is so so.   She does have some added relationship/family stress.  Never has had a good home life.  Other history includes: headaches of unknown type diagnosed in the past.  Prior chronic headache treatment includes: OTC analgesics and headache cocktail at the emergency dept, recently twice in the last 2 months.     Mood still an issue, depressed mood chronically. Was on medication as a teenager. Feels like she needs to go back on medication.  Denies suicidal or homicidal ideation. Sometimes has mood swings.  Relationship with boyfriend ok.   Has a lot of stress related to home life.  Her sister and her kids live in the home.  There is limited space, and they all share the same  bathroom.  She notes financial stress, other day to day stressors.    Has hx/o hiatal hernia.  She notes hx/o EGD at age 27yo.  Since last visit heartburn and nausea much improved on Dexilant.  No other c/o.  The following portions of the patient's history were reviewed and updated as appropriate: allergies, current medications, past family history, past medical history, past social history, past surgical history and problem list.  ROS Otherwise as in subjective above  Objective: Physical Exam  Vital signs reviewed  General appearance: alert, no distress, WD/WN, lean AA female Abdomen: +bs, soft, mild suprapubic and LLQ tenderness, non distended, no masses, no hepatomegaly, no splenomegaly Psych: somewhat depressed mood, pleasant, answerers questions appropriately Skin: left labia majora inferiorly with 2cm area of induration and tenderness, no warmth or drainage, no fluctuance. Exam chaperoned by nurse   Assessment: Encounter Diagnoses  Name Primary?  . Chronic headache Yes  . Depression   . Recurrent boils   . Hiatal hernia   . Irregular periods   . Nausea alone   . Loss of weight   . Microscopic hematuria      Plan: Chronic headache - likely stress induced.  C/t relpax prn for migraine. Depression - begin Paxil.  Discussed risks /benefits of medication.  Discussed mood, recommended counseling.  F/u 3-4 wk, sooner prn. Recurrent boils - use own soap, separate hygiene products from others in the house.   Begin doxycycline.  Recheck 3-4wk. Hiatal hernia,  nausea, loss of weight - improved on Dexilant.  Avoid trigger foods. Recheck 3-4wk on weight. iregular period - urine pregnancy negative today.  F/u in 3-4 wk. microscopic hematuria - resolved

## 2012-10-18 NOTE — Patient Instructions (Signed)
Begin Paxil for mood.  Call if any problems.  Consider counseling.  Continue Dexilant to help with nausea, appetite and heartburn.  Let recheck in 3-4 wk to make sure weight is stable.   Use your on separate soap, use clean razors with shaving, begin Doxycycline antibiotic twice daily for 10 days for boil.  Recheck in 3-4 weeks.

## 2012-11-15 ENCOUNTER — Ambulatory Visit: Payer: BC Managed Care – PPO | Admitting: Medical

## 2012-11-18 ENCOUNTER — Other Ambulatory Visit: Payer: Self-pay

## 2012-11-19 ENCOUNTER — Ambulatory Visit (INDEPENDENT_AMBULATORY_CARE_PROVIDER_SITE_OTHER): Payer: BC Managed Care – PPO | Admitting: Medical

## 2012-11-19 ENCOUNTER — Encounter: Payer: Self-pay | Admitting: Medical

## 2012-11-19 ENCOUNTER — Telehealth: Payer: Self-pay | Admitting: Medical

## 2012-11-19 VITALS — BP 90/60 | HR 80 | Temp 98.3°F | Resp 16 | Wt 145.0 lb

## 2012-11-19 DIAGNOSIS — R1013 Epigastric pain: Secondary | ICD-10-CM

## 2012-11-19 DIAGNOSIS — R112 Nausea with vomiting, unspecified: Secondary | ICD-10-CM

## 2012-11-19 DIAGNOSIS — F329 Major depressive disorder, single episode, unspecified: Secondary | ICD-10-CM

## 2012-11-19 DIAGNOSIS — F3289 Other specified depressive episodes: Secondary | ICD-10-CM

## 2012-11-19 DIAGNOSIS — N926 Irregular menstruation, unspecified: Secondary | ICD-10-CM

## 2012-11-19 DIAGNOSIS — R634 Abnormal weight loss: Secondary | ICD-10-CM

## 2012-11-19 DIAGNOSIS — R51 Headache: Secondary | ICD-10-CM

## 2012-11-19 DIAGNOSIS — F32A Depression, unspecified: Secondary | ICD-10-CM

## 2012-11-19 MED ORDER — MEDROXYPROGESTERONE ACETATE 10 MG PO TABS: 10.0000 mg | ORAL_TABLET | Freq: Every day | ORAL | Status: DC

## 2012-11-19 MED ORDER — SUMATRIPTAN SUCCINATE 25 MG PO TABS: ORAL_TABLET | ORAL | Status: DC

## 2012-11-19 NOTE — Progress Notes (Signed)
Subjective: Here for recheck.  Since last visit she notes headaches and mood improved on Paxil.  However, she did ultimately break up with her boyfriend of 1 year yesterday.  So she is down about this today.   She still c/o nausea, weight loss, abdominal pain.  Last visit dexilant was improving her symptom, but for whatever reason, she stopped dexilant.  She didn't get relpax refilled due to cost.   She notes irregular periods.  Prior to 03/2012 periods were regularly although the first day is usually quite crampy, but periods don't usually last but a few days.   However, went on Depo Provera in March for irregular periods and this helped.  Stopped depo in June/didn't get the shot as this was when she was having mood problems.  She does have tubal ligation and has 3 children.  She notes since June been having periods every 2nd week of the month, then last month had 2 different weeks of period.   Now this month so far has had 2wk of ongoing period, bleeding now.  No other c/o.  The following portions of the patient's history were reviewed and updated as appropriate: allergies, current medications, past family history, past medical history, past social history, past surgical history and problem list.  ROS Otherwise as in subjective above  Objective: Physical Exam BP 90/60  Pulse 80  Temp(Src) 98.3 F (36.8 C) (Oral)  Resp 16  Wt 145 lb (65.772 kg)   General appearance: alert, no distress, WD/WN, lean AA female Abdomen: +bs, soft, epigastric and RUQ tenderness, non distended, no masses, no hepatomegaly, no splenomegaly Psych: somewhat depressed mood, pleasant, answerers questions appropriately Gyn: normal external genitalia, obvious blood in vault, uterus seems leftward position and somewhat retroverted, otherwise nontender, no obvious mass or abnormal size of uterus, exam chaperoned by nurse   Assessment: Encounter Diagnoses  Name Primary?  . Irregular periods Yes  . Depression   .  Headache(784.0)   . Abdominal pain, epigastric   . Nausea with vomiting   . Loss of weight      Plan: Irregular periods - begin short course of provera.  Call back in 1wk with update on symptoms. Depression, headaches - improved on Paxil.  Has imitrex script for prn use. Abdomina pain, nausea, weight loss - c/t Dexilant, referral to GI

## 2012-11-19 NOTE — Telephone Encounter (Signed)
Refer to Dr. Elnoria Howard, GI

## 2012-11-22 NOTE — Telephone Encounter (Signed)
I am working on her referral. CLS

## 2012-11-25 ENCOUNTER — Telehealth: Payer: Self-pay | Admitting: Medical

## 2012-11-25 NOTE — Telephone Encounter (Addendum)
Pt called to find out date we show that we gave her for her appt to Dr Elnoria Howard for today. Couldnt find documentation in system(referral) or on paper so called Dr Elnoria Howard office, appt is 11/17@8 :30. Pt states that is new appt. She went to appt today @ 2:40 because we told her that & appt was yesterday at 2:40 so had to resched appt to 11/17. I called Dr Haywood Pao office, Darnelle Catalan verified that pt wouldn't be charged a no show fee. Can you enter the referral portion?

## 2012-11-26 ENCOUNTER — Other Ambulatory Visit: Payer: Self-pay | Admitting: Family Medicine

## 2012-11-26 DIAGNOSIS — R109 Unspecified abdominal pain: Secondary | ICD-10-CM

## 2012-11-26 NOTE — Telephone Encounter (Signed)
DONE

## 2012-12-02 ENCOUNTER — Encounter: Payer: Self-pay | Admitting: Internal Medicine

## 2012-12-16 ENCOUNTER — Encounter: Payer: Self-pay | Admitting: Medical

## 2013-02-18 ENCOUNTER — Ambulatory Visit: Payer: BC Managed Care – PPO | Admitting: Medical

## 2013-02-21 ENCOUNTER — Ambulatory Visit: Payer: BC Managed Care – PPO | Admitting: Medical

## 2013-03-22 ENCOUNTER — Encounter: Payer: Self-pay | Admitting: Medical

## 2013-03-22 ENCOUNTER — Ambulatory Visit (INDEPENDENT_AMBULATORY_CARE_PROVIDER_SITE_OTHER): Payer: BC Managed Care – PPO | Admitting: Medical

## 2013-03-22 VITALS — BP 128/70 | HR 68 | Temp 98.7°F | Resp 16 | Wt 153.0 lb

## 2013-03-22 DIAGNOSIS — N926 Irregular menstruation, unspecified: Secondary | ICD-10-CM

## 2013-03-22 DIAGNOSIS — N946 Dysmenorrhea, unspecified: Secondary | ICD-10-CM

## 2013-03-22 DIAGNOSIS — J029 Acute pharyngitis, unspecified: Secondary | ICD-10-CM

## 2013-03-22 DIAGNOSIS — T300 Burn of unspecified body region, unspecified degree: Secondary | ICD-10-CM

## 2013-03-22 LAB — POCT URINALYSIS DIPSTICK
BILIRUBIN UA: NEGATIVE
GLUCOSE UA: NEGATIVE
Ketones, UA: NEGATIVE
LEUKOCYTES UA: NEGATIVE
NITRITE UA: NEGATIVE
Protein, UA: NEGATIVE
RBC UA: NEGATIVE
Spec Grav, UA: 1.015
UROBILINOGEN UA: NEGATIVE
pH, UA: 5

## 2013-03-22 LAB — POCT URINE PREGNANCY: PREG TEST UR: NEGATIVE

## 2013-03-22 LAB — POCT RAPID STREP A (OFFICE): RAPID STREP A SCREEN: NEGATIVE

## 2013-03-22 MED ORDER — SILVER SULFADIAZINE 1 % EX CREA: 1.0000 "application " | TOPICAL_CREAM | Freq: Every day | CUTANEOUS | Status: DC
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
## 2013-06-08 ENCOUNTER — Encounter (HOSPITAL_COMMUNITY): Payer: Self-pay | Admitting: Emergency Medicine

## 2013-06-08 ENCOUNTER — Emergency Department (HOSPITAL_COMMUNITY)
Admission: EM | Admit: 2013-06-08 | Discharge: 2013-06-09 | Disposition: A | Discharge status: Home / Self Care | Payer: BC Managed Care – PPO | Attending: Emergency Medicine | Admitting: Emergency Medicine

## 2013-06-08 DIAGNOSIS — Z8719 Personal history of other diseases of the digestive system: Secondary | ICD-10-CM | POA: Insufficient documentation

## 2013-06-08 DIAGNOSIS — Z3202 Encounter for pregnancy test, result negative: Secondary | ICD-10-CM | POA: Insufficient documentation

## 2013-06-08 DIAGNOSIS — R51 Headache: Secondary | ICD-10-CM | POA: Insufficient documentation

## 2013-06-08 DIAGNOSIS — Z79899 Other long term (current) drug therapy: Secondary | ICD-10-CM | POA: Insufficient documentation

## 2013-06-08 DIAGNOSIS — R1110 Vomiting, unspecified: Secondary | ICD-10-CM

## 2013-06-08 DIAGNOSIS — Z8659 Personal history of other mental and behavioral disorders: Secondary | ICD-10-CM | POA: Insufficient documentation

## 2013-06-08 DIAGNOSIS — Z87440 Personal history of urinary (tract) infections: Secondary | ICD-10-CM | POA: Insufficient documentation

## 2013-06-08 DIAGNOSIS — Z872 Personal history of diseases of the skin and subcutaneous tissue: Secondary | ICD-10-CM | POA: Insufficient documentation

## 2013-06-08 DIAGNOSIS — F17200 Nicotine dependence, unspecified, uncomplicated: Secondary | ICD-10-CM | POA: Insufficient documentation

## 2013-06-08 LAB — URINE MICROSCOPIC-ADD ON

## 2013-06-08 LAB — COMPREHENSIVE METABOLIC PANEL
ALBUMIN: 3.6 g/dL (ref 3.5–5.2)
ALT: 7 U/L (ref 0–35)
AST: 14 U/L (ref 0–37)
Alkaline Phosphatase: 32 U/L — ABNORMAL LOW (ref 39–117)
BILIRUBIN TOTAL: 0.3 mg/dL (ref 0.3–1.2)
BUN: 8 mg/dL (ref 6–23)
CHLORIDE: 100 meq/L (ref 96–112)
CO2: 26 meq/L (ref 19–32)
Calcium: 9.2 mg/dL (ref 8.4–10.5)
Creatinine, Ser: 0.74 mg/dL (ref 0.50–1.10)
GFR calc Af Amer: >90 mL/min (ref >90)
Glucose, Bld: 87 mg/dL (ref 70–99)
Potassium: 3.9 mEq/L (ref 3.7–5.3)
SODIUM: 139 meq/L (ref 137–147)
Total Protein: 7.2 g/dL (ref 6.0–8.3)

## 2013-06-08 LAB — URINALYSIS, ROUTINE W REFLEX MICROSCOPIC
GLUCOSE, UA: NEGATIVE mg/dL
HGB URINE DIPSTICK: NEGATIVE
KETONES UR: 15 mg/dL — AB
Nitrite: NEGATIVE
PH: 6 (ref 5.0–8.0)
PROTEIN: NEGATIVE mg/dL
Specific Gravity, Urine: 1.027 (ref 1.005–1.030)
Urobilinogen, UA: 1 mg/dL (ref 0.0–1.0)

## 2013-06-08 LAB — CBC WITH DIFFERENTIAL/PLATELET
BASOS ABS: 0 10*3/uL (ref 0.0–0.1)
BASOS PCT: 0 % (ref 0–1)
Eosinophils Absolute: 0.1 10*3/uL (ref 0.0–0.7)
Eosinophils Relative: 1 % (ref 0–5)
HCT: 40.7 % (ref 36.0–46.0)
Hemoglobin: 13.7 g/dL (ref 12.0–15.0)
LYMPHS PCT: 33 % (ref 12–46)
Lymphs Abs: 2.9 10*3/uL (ref 0.7–4.0)
MCH: 28.8 pg (ref 26.0–34.0)
MCHC: 33.7 g/dL (ref 30.0–36.0)
MCV: 85.7 fL (ref 78.0–100.0)
Monocytes Absolute: 0.6 10*3/uL (ref 0.1–1.0)
Monocytes Relative: 7 % (ref 3–12)
NEUTROS ABS: 5.3 10*3/uL (ref 1.7–7.7)
NEUTROS PCT: 59 % (ref 43–77)
PLATELETS: 267 10*3/uL (ref 150–400)
RBC: 4.75 MIL/uL (ref 3.87–5.11)
RDW: 12.6 % (ref 11.5–15.5)
WBC: 8.9 10*3/uL (ref 4.0–10.5)

## 2013-06-08 LAB — PREGNANCY, URINE: Preg Test, Ur: NEGATIVE

## 2013-06-08 MED ORDER — ONDANSETRON HCL 4 MG/2ML IJ SOLN
4.0000 mg | Freq: Once | INTRAMUSCULAR | Status: AC
  Administered 2013-06-08: 4 mg via INTRAVENOUS
  Filled 2013-06-08: qty 2

## 2013-06-08 MED ORDER — SODIUM CHLORIDE 0.9 % IV BOLUS (SEPSIS)
1000.0000 mL | Freq: Once | INTRAVENOUS | Status: AC
  Administered 2013-06-08: 1000 mL via INTRAVENOUS

## 2013-06-08 MED ORDER — PROMETHAZINE HCL 25 MG/ML IJ SOLN
25.0000 mg | Freq: Once | INTRAMUSCULAR | Status: AC
  Administered 2013-06-08: 25 mg via INTRAVENOUS
  Filled 2013-06-08: qty 1

## 2013-06-08 NOTE — ED Provider Notes (Signed)
CSN: [id number]     Arrival date & time 06/08/13  2034 History   First MD Initiated Contact with Patient 06/08/13 2223     Chief Complaint  Patient presents with  . Emesis     (Consider location/radiation/quality/duration/timing/severity/associated sxs/prior Treatment) HPI  This is a 28 year old female with history of hiatal hernia and reflux who presents with vomiting. Patient reports isolated nonbilious, nonbloody emesis multiple times today. She denies any abdominal pain or diarrhea. Patient states that she's developed a headache after the vomiting. Denies similar symptoms in the past. She's been unable to keep down liquids or solids. Last normal bowel movement was yesterday. No known sick contacts.  Past Medical History  Diagnosis Date  . Hiatal hernia   . History of urinary tract infection   . Anxiety   . Depression     age 28yo  . GERD (gastroesophageal reflux disease)   . Wears contact lenses   . Eczema    Past Surgical History  Procedure Laterality Date  . Tubal ligation    . Esophagogastroduodenoscopy     Family History  Problem Relation Age of Onset  . Hypertension Other   . Diabetes Other    History  Substance Use Topics  . Smoking status: Current Every Day Smoker -- 0.50 packs/day    Types: Cigarettes  . Smokeless tobacco: Not on file  . Alcohol Use: No   OB History   Grav Para Term Preterm Abortions TAB SAB Ect Mult Living                 Review of Systems  Constitutional: Negative for fever.  Respiratory: Negative for chest tightness and shortness of breath.   Cardiovascular: Negative for chest pain.  Gastrointestinal: Positive for vomiting. Negative for abdominal pain, diarrhea and constipation.  Genitourinary: Negative for dysuria.  Skin: Negative for wound.  Neurological: Negative for headaches.  All other systems reviewed and are negative.     Allergies  Review of patient's allergies indicates no known allergies.  Home Medications    Prior to Admission medications   Medication Sig Start Date End Date Taking? Authorizing Provider  ibuprofen (ADVIL,MOTRIN) 200 MG tablet Take 600 mg by mouth every 6 (six) hours as needed for headache.   Yes Historical Provider, MD  MICROGESTIN 1-20 MG-MCG tablet Take 1 tablet by mouth daily. 05/17/13  Yes Historical Provider, MD   BP 101/70  Pulse 71  Temp(Src) 99.3 F (37.4 C) (Oral)  Resp 18  SpO2 100%  LMP 03/29/2013 Physical Exam  Nursing note and vitals reviewed. Constitutional: She is oriented to person, place, and time. She appears well-developed and well-nourished. No distress.  HENT:  Head: Normocephalic and atraumatic.  Mouth/Throat: Oropharynx is clear and moist.  Eyes: Pupils are equal, round, and reactive to light.  Neck: Neck supple.  Cardiovascular: Normal rate, regular rhythm and normal heart sounds.   Pulmonary/Chest: Effort normal and breath sounds normal. No respiratory distress. She has no wheezes.  Abdominal: Soft. Bowel sounds are normal. There is no tenderness. There is no rebound and no guarding.  Neurological: She is alert and oriented to person, place, and time.  Skin: Skin is warm and dry.  Psychiatric: She has a normal mood and affect.    ED Course  Procedures (including critical care time) Labs Review Labs Reviewed  COMPREHENSIVE METABOLIC PANEL - Abnormal; Notable for the following:    Alkaline Phosphatase 32 (*)    All other components within normal limits  URINALYSIS, ROUTINE W  REFLEX MICROSCOPIC - Abnormal; Notable for the following:    Color, Urine AMBER (*)    Bilirubin Urine SMALL (*)    Ketones, ur 15 (*)    Leukocytes, UA SMALL (*)    All other components within normal limits  URINE MICROSCOPIC-ADD ON - Abnormal; Notable for the following:    Squamous Epithelial / LPF FEW (*)    All other components within normal limits  CBC WITH DIFFERENTIAL  PREGNANCY, URINE    Imaging Review No results found.   EKG Interpretation None       MDM   Final diagnoses:  Vomiting    Patient presents with vomiting isolated for the last 8 hours. She is nontoxic on exam she appears well-hydrated. Basic labwork obtained from triage and is reassuring. Patient does have 15 ketones in the urine. Patient was given fluids and antiemetic.  Patient's abdominal exam is benign and she is having regular bowel movements. Low suspicion at this time for acute obstruction. Suspect early gastroenteritis versus gastroenteritis. Patient was able tolerate by mouth. Will discharge home with nausea medication.  Patient given strict return precautions.  After history, exam, and medical workup I feel the patient has been appropriately medically screened and is safe for discharge home. Pertinent diagnoses were discussed with the patient. Patient was given return precautions.     Shon Baton, MD 06/09/13 [phone]

## 2013-06-08 NOTE — ED Notes (Signed)
Pt states she has been vomiting since 1pm today  Pt denies any abd pain   Pt is c/o headache  Pt states she has vomited about 7 to 8 times today

## 2013-06-08 NOTE — ED Notes (Signed)
Pt given ginger ale.

## 2013-06-08 NOTE — ED Notes (Signed)
Patient vomiting at this time.

## 2013-06-09 MED ORDER — PROMETHAZINE HCL 25 MG PO TABS: 25.0000 mg | ORAL_TABLET | Freq: Four times a day (QID) | ORAL | Status: DC | PRN

## 2013-06-09 NOTE — Discharge Instructions (Signed)

## 2013-09-23 ENCOUNTER — Encounter (HOSPITAL_COMMUNITY): Payer: Self-pay | Admitting: Emergency Medicine

## 2013-09-23 ENCOUNTER — Emergency Department (HOSPITAL_COMMUNITY): Payer: Medicaid Other

## 2013-09-23 ENCOUNTER — Emergency Department (HOSPITAL_COMMUNITY)
Admission: EM | Admit: 2013-09-23 | Discharge: 2013-09-24 | Disposition: A | Discharge status: Home / Self Care | Payer: Medicaid Other | Attending: Emergency Medicine | Admitting: Emergency Medicine

## 2013-09-23 DIAGNOSIS — Z8719 Personal history of other diseases of the digestive system: Secondary | ICD-10-CM | POA: Insufficient documentation

## 2013-09-23 DIAGNOSIS — R1032 Left lower quadrant pain: Secondary | ICD-10-CM | POA: Insufficient documentation

## 2013-09-23 DIAGNOSIS — Z872 Personal history of diseases of the skin and subcutaneous tissue: Secondary | ICD-10-CM | POA: Insufficient documentation

## 2013-09-23 DIAGNOSIS — R1033 Periumbilical pain: Secondary | ICD-10-CM | POA: Diagnosis not present

## 2013-09-23 DIAGNOSIS — R102 Pelvic and perineal pain: Secondary | ICD-10-CM

## 2013-09-23 DIAGNOSIS — Z3202 Encounter for pregnancy test, result negative: Secondary | ICD-10-CM | POA: Diagnosis not present

## 2013-09-23 DIAGNOSIS — F17200 Nicotine dependence, unspecified, uncomplicated: Secondary | ICD-10-CM | POA: Diagnosis not present

## 2013-09-23 DIAGNOSIS — Z87440 Personal history of urinary (tract) infections: Secondary | ICD-10-CM | POA: Insufficient documentation

## 2013-09-23 DIAGNOSIS — Z792 Long term (current) use of antibiotics: Secondary | ICD-10-CM | POA: Diagnosis not present

## 2013-09-23 DIAGNOSIS — Z8659 Personal history of other mental and behavioral disorders: Secondary | ICD-10-CM | POA: Diagnosis not present

## 2013-09-23 DIAGNOSIS — Z79899 Other long term (current) drug therapy: Secondary | ICD-10-CM | POA: Diagnosis not present

## 2013-09-23 DIAGNOSIS — B9689 Other specified bacterial agents as the cause of diseases classified elsewhere: Secondary | ICD-10-CM

## 2013-09-23 DIAGNOSIS — N949 Unspecified condition associated with female genital organs and menstrual cycle: Secondary | ICD-10-CM | POA: Insufficient documentation

## 2013-09-23 DIAGNOSIS — N760 Acute vaginitis: Secondary | ICD-10-CM | POA: Diagnosis not present

## 2013-09-23 HISTORY — DX: Other specified noninflammatory disorders of uterus: N85.8

## 2013-09-23 LAB — COMPREHENSIVE METABOLIC PANEL
ALK PHOS: 37 U/L — AB (ref 39–117)
ALT: 7 U/L (ref 0–35)
AST: 13 U/L (ref 0–37)
Albumin: 3.3 g/dL — ABNORMAL LOW (ref 3.5–5.2)
Anion gap: 10 (ref 5–15)
BUN: 9 mg/dL (ref 6–23)
CHLORIDE: 102 meq/L (ref 96–112)
CO2: 26 meq/L (ref 19–32)
Calcium: 8.5 mg/dL (ref 8.4–10.5)
Creatinine, Ser: 0.71 mg/dL (ref 0.50–1.10)
GFR calc Af Amer: >90 mL/min (ref >90)
Glucose, Bld: 113 mg/dL — ABNORMAL HIGH (ref 70–99)
POTASSIUM: 4.1 meq/L (ref 3.7–5.3)
Sodium: 138 mEq/L (ref 137–147)
Total Bilirubin: 0.5 mg/dL (ref 0.3–1.2)
Total Protein: 7.1 g/dL (ref 6.0–8.3)

## 2013-09-23 LAB — CBC WITH DIFFERENTIAL/PLATELET
Basophils Absolute: 0 10*3/uL (ref 0.0–0.1)
Basophils Relative: 0 % (ref 0–1)
Eosinophils Absolute: 0 10*3/uL (ref 0.0–0.7)
Eosinophils Relative: 0 % (ref 0–5)
HEMATOCRIT: 41.4 % (ref 36.0–46.0)
Hemoglobin: 14.3 g/dL (ref 12.0–15.0)
LYMPHS PCT: 10 % — AB (ref 12–46)
Lymphs Abs: 1.1 10*3/uL (ref 0.7–4.0)
MCH: 30.2 pg (ref 26.0–34.0)
MCHC: 34.5 g/dL (ref 30.0–36.0)
MCV: 87.3 fL (ref 78.0–100.0)
MONO ABS: 0.6 10*3/uL (ref 0.1–1.0)
Monocytes Relative: 6 % (ref 3–12)
NEUTROS ABS: 8.5 10*3/uL — AB (ref 1.7–7.7)
Neutrophils Relative %: 84 % — ABNORMAL HIGH (ref 43–77)
Platelets: 227 10*3/uL (ref 150–400)
RBC: 4.74 MIL/uL (ref 3.87–5.11)
RDW: 12.5 % (ref 11.5–15.5)
WBC: 10.3 10*3/uL (ref 4.0–10.5)

## 2013-09-23 LAB — LIPASE, BLOOD: Lipase: 25 U/L (ref 11–59)

## 2013-09-23 LAB — URINALYSIS, ROUTINE W REFLEX MICROSCOPIC
GLUCOSE, UA: NEGATIVE mg/dL
Hgb urine dipstick: NEGATIVE
KETONES UR: 15 mg/dL — AB
Leukocytes, UA: NEGATIVE
Nitrite: NEGATIVE
PH: 5 (ref 5.0–8.0)
Protein, ur: NEGATIVE mg/dL
Specific Gravity, Urine: 1.024 (ref 1.005–1.030)
Urobilinogen, UA: 0.2 mg/dL (ref 0.0–1.0)

## 2013-09-23 LAB — WET PREP, GENITAL
Trich, Wet Prep: NONE SEEN
YEAST WET PREP: NONE SEEN

## 2013-09-23 LAB — POC URINE PREG, ED: Preg Test, Ur: NEGATIVE

## 2013-09-23 MED ORDER — ONDANSETRON 4 MG PO TBDP
8.0000 mg | ORAL_TABLET | Freq: Once | ORAL | Status: AC
  Administered 2013-09-23: 8 mg via ORAL
  Filled 2013-09-23: qty 2

## 2013-09-23 MED ORDER — AZITHROMYCIN 1 G PO PACK
1.0000 g | PACK | Freq: Once | ORAL | Status: AC
  Administered 2013-09-23: 1 g via ORAL
  Filled 2013-09-23: qty 1

## 2013-09-23 MED ORDER — LIDOCAINE HCL (PF) 1 % IJ SOLN
INTRAMUSCULAR | Status: AC
  Administered 2013-09-23: 0.9 mL
  Filled 2013-09-23: qty 5

## 2013-09-23 MED ORDER — FENTANYL CITRATE 0.05 MG/ML IJ SOLN
50.0000 ug | Freq: Once | INTRAMUSCULAR | Status: AC
  Administered 2013-09-23: 50 ug via NASAL
  Filled 2013-09-23: qty 2

## 2013-09-23 MED ORDER — ONDANSETRON HCL 4 MG/2ML IJ SOLN
4.0000 mg | Freq: Once | INTRAMUSCULAR | Status: AC
  Administered 2013-09-23: 4 mg via INTRAVENOUS
  Filled 2013-09-23: qty 2

## 2013-09-23 MED ORDER — SODIUM CHLORIDE 0.9 % IV BOLUS (SEPSIS)
1000.0000 mL | Freq: Once | INTRAVENOUS | Status: AC
  Administered 2013-09-23: 1000 mL via INTRAVENOUS

## 2013-09-23 MED ORDER — MORPHINE SULFATE 4 MG/ML IJ SOLN
4.0000 mg | Freq: Once | INTRAMUSCULAR | Status: AC
  Administered 2013-09-23: 4 mg via INTRAVENOUS
  Filled 2013-09-23: qty 1

## 2013-09-23 MED ORDER — CEFTRIAXONE SODIUM 250 MG IJ SOLR
250.0000 mg | Freq: Once | INTRAMUSCULAR | Status: AC
  Administered 2013-09-23: 250 mg via INTRAMUSCULAR
  Filled 2013-09-23: qty 250

## 2013-09-23 NOTE — ED Notes (Signed)
Pelvic Exam complete. Dr Rexanne Mano at the bedside

## 2013-09-23 NOTE — ED Provider Notes (Signed)
CSN: [id number]     Arrival date & time 09/23/13  1444 History   First MD Initiated Contact with Patient 09/23/13 1920     Chief Complaint  Patient presents with  . Migraine  . Abdominal Pain     (Consider location/radiation/quality/duration/timing/severity/associated sxs/prior Treatment) HPI [name] with history of hiatal hernia bilateral tubal ligation presenting with one day of abdominal pain in her left hypogastric and left lower quadrant region. Patient states the pain began this morning around 5 AM, when she noticed it waking her from sleep. Patient states the pain was a "twisting sensation" which "comes and goes". Patient states when the pain is present, is there for 2-3 minutes, then goes away. She states the frequency has been random all day. Patient also reports some associated nausea with vomiting x2 today. Patient denies any associated diarrhea, fever, dysuria, vaginal pain, vaginal discharge, vaginal bleeding. Patient states there are no alleviating or aggravating factors of her pain.  Past Medical History  Diagnosis Date  . Hiatal hernia   . History of urinary tract infection   . Anxiety   . Depression     age 22yo  . GERD (gastroesophageal reflux disease)   . Wears contact lenses   . Eczema   . Uterine cyst    Past Surgical History  Procedure Laterality Date  . Tubal ligation    . Esophagogastroduodenoscopy     Family History  Problem Relation Age of Onset  . Hypertension Other   . Diabetes Other    History  Substance Use Topics  . Smoking status: Current Every Day Smoker -- 0.50 packs/day    Types: Cigarettes  . Smokeless tobacco: Not on file  . Alcohol Use: No   OB History   Grav Para Term Preterm Abortions TAB SAB Ect Mult Living                 Review of Systems  Constitutional: Negative for fever.  HENT: Negative for trouble swallowing.   Eyes: Negative for visual disturbance.  Respiratory: Negative for shortness of breath.   Cardiovascular:  Negative for chest pain.  Gastrointestinal: Positive for nausea, vomiting and abdominal pain. Negative for diarrhea, constipation and blood in stool.  Genitourinary: Negative for dysuria, urgency, flank pain, decreased urine volume, vaginal bleeding, vaginal discharge, vaginal pain and pelvic pain.  Musculoskeletal: Negative for neck pain.  Skin: Negative for rash.  Neurological: Negative for dizziness, syncope, weakness and numbness.  Psychiatric/Behavioral: Negative.       Allergies  Review of patient's allergies indicates no known allergies.  Home Medications   Prior to Admission medications   Medication Sig Start Date End Date Taking? Authorizing Provider  ibuprofen (ADVIL,MOTRIN) 200 MG tablet Take 600 mg by mouth every 6 (six) hours as needed for headache.   Yes Historical Provider, MD  MICROGESTIN 1-20 MG-MCG tablet Take 1 tablet by mouth daily. 05/17/13  Yes Historical Provider, MD  HYDROcodone-acetaminophen (NORCO/VICODIN) 5-325 MG per tablet Take 1-2 tablets by mouth every 6 (six) hours as needed. 09/24/13   Monte Fantasia, PA-C  metroNIDAZOLE (FLAGYL) 500 MG tablet Take 1 tablet (500 mg total) by mouth 2 (two) times daily. One po bid x 7 days 09/23/13   Monte Fantasia, PA-C  ondansetron [facility]) 4 MG tablet Take 1 tablet (4 mg total) by mouth every 8 (eight) hours as needed for nausea or vomiting. 09/24/13   Monte Fantasia, PA-C   BP 99/65  Pulse 71  Temp(Src) 98.9 F (37.2  C) (Oral)  Resp 16  Ht  (1.702 m)  Wt 159 lb (72.122 kg)  BMI 24.90 kg/m2  SpO2 100%  LMP 09/18/2013 Physical Exam  Nursing note and vitals reviewed. Constitutional: She is oriented to person, place, and time. She appears well-developed and well-nourished. No distress.  HENT:  Head: Normocephalic and atraumatic.  Mouth/Throat: Oropharynx is clear and moist. No oropharyngeal exudate.  Eyes: Right eye exhibits no discharge. Left eye exhibits no discharge. No scleral icterus.  Neck: Normal range of  motion.  Cardiovascular: Normal rate, regular rhythm and normal heart sounds.   No murmur heard. Pulmonary/Chest: Effort normal and breath sounds normal. No respiratory distress.  Abdominal: Soft. Normal appearance and bowel sounds are normal. She exhibits no pulsatile midline mass. There is tenderness in the periumbilical area and left lower quadrant. There is no rigidity, no rebound, no guarding, no tenderness at McBurney's point and negative Murphy's sign.    Tenderness to light and deep palpation noted as demarcated.  Genitourinary: Pelvic exam was performed with patient supine. There is no rash, tenderness, lesion or injury on the right labia. There is no rash, tenderness, lesion or injury on the left labia. Uterus is not tender. Cervix exhibits discharge and friability. Cervix exhibits no motion tenderness. Right adnexum displays no mass, no tenderness and no fullness. Left adnexum displays no mass, no tenderness and no fullness. No signs of injury around the vagina. Vaginal discharge found.  Small amount of friability noted cervix. With a greenish/yellow mild amount of discharge noted. Mild adnexal tenderness noted bilaterally. No cervical motion tenderness.  Chaperone present during entire pelvic exam.  Musculoskeletal: Normal range of motion. She exhibits no edema and no tenderness.  Neurological: She is alert and oriented to person, place, and time. She has normal strength. No cranial nerve deficit or sensory deficit. Coordination normal.  Skin: Skin is warm and dry. No rash noted. She is not diaphoretic.  Psychiatric: She has a normal mood and affect.    ED Course  Procedures (including critical care time) Labs Review Labs Reviewed  WET PREP, GENITAL - Abnormal; Notable for the following:    Clue Cells Wet Prep HPF POC FEW (*)    WBC, Wet Prep HPF POC FEW (*)    All other components within normal limits  CBC WITH DIFFERENTIAL - Abnormal; Notable for the following:    Neutrophils  Relative % 84 (*)    Neutro Abs 8.5 (*)    Lymphocytes Relative 10 (*)    All other components within normal limits  COMPREHENSIVE METABOLIC PANEL - Abnormal; Notable for the following:    Glucose, Bld 113 (*)    Albumin 3.3 (*)    Alkaline Phosphatase 37 (*)    All other components within normal limits  URINALYSIS, ROUTINE W REFLEX MICROSCOPIC - Abnormal; Notable for the following:    Color, Urine AMBER (*)    Bilirubin Urine SMALL (*)    Ketones, ur 15 (*)    All other components within normal limits  GC/CHLAMYDIA PROBE AMP  LIPASE, BLOOD  POC URINE PREG, ED    Imaging Review US Transvaginal Non-ob  09/23/2013   CLINICAL DATA:  Right lower quadrant abdominal tenderness.  EXAM: TRANSABDOMINAL AND TRANSVAGINAL ULTRASOUND OF PELVIS  DOPPLER ULTRASOUND OF OVARIES  TECHNIQUE: Both transabdominal and transvaginal ultrasound examinations of the pelvis were performed. Transabdominal technique was performed for global imaging of the pelvis including uterus, ovaries, adnexal regions, and pelvic cul-de-sac.  It was necessary to proceed  with endovaginal exam following the transabdominal exam to visualize the uterus and ovaries in greater detail. Color and duplex Doppler ultrasound was utilized to evaluate blood flow to the ovaries.  COMPARISON:  None.  FINDINGS: Uterus  Measurements: 8.5 x 3.9 x 5.0 cm. No fibroids or other mass visualized.  Endometrium  Thickness: 0.4 cm.  No focal abnormality visualized.  Right ovary  Measurements: 3.8 x 2.2 x 1.9 cm. Normal appearance/no adnexal mass.  Left ovary  Measurements: 3.5 x 1.6 x 1.8 cm. Normal appearance/no adnexal mass.  Pulsed Doppler evaluation of both ovaries demonstrates normal low-resistance arterial and venous waveforms.  Other findings  A small amount of free fluid is seen within the pelvic cul-de-sac, likely physiologic in nature.  IMPRESSION: Unremarkable pelvic ultrasound.  No evidence for ovarian torsion.   Electronically Signed   By: Roanna Raider M.D.   On: 09/23/2013 22:56   US Pelvis Complete  09/23/2013   CLINICAL DATA:  Right lower quadrant abdominal tenderness.  EXAM: TRANSABDOMINAL AND TRANSVAGINAL ULTRASOUND OF PELVIS  DOPPLER ULTRASOUND OF OVARIES  TECHNIQUE: Both transabdominal and transvaginal ultrasound examinations of the pelvis were performed. Transabdominal technique was performed for global imaging of the pelvis including uterus, ovaries, adnexal regions, and pelvic cul-de-sac.  It was necessary to proceed with endovaginal exam following the transabdominal exam to visualize the uterus and ovaries in greater detail. Color and duplex Doppler ultrasound was utilized to evaluate blood flow to the ovaries.  COMPARISON:  None.  FINDINGS: Uterus  Measurements: 8.5 x 3.9 x 5.0 cm. No fibroids or other mass visualized.  Endometrium  Thickness: 0.4 cm.  No focal abnormality visualized.  Right ovary  Measurements: 3.8 x 2.2 x 1.9 cm. Normal appearance/no adnexal mass.  Left ovary  Measurements: 3.5 x 1.6 x 1.8 cm. Normal appearance/no adnexal mass.  Pulsed Doppler evaluation of both ovaries demonstrates normal low-resistance arterial and venous waveforms.  Other findings  A small amount of free fluid is seen within the pelvic cul-de-sac, likely physiologic in nature.  IMPRESSION: Unremarkable pelvic ultrasound.  No evidence for ovarian torsion.   Electronically Signed   By: Roanna Raider M.D.   On: 09/23/2013 22:56   [place] Art/ven Flow Abd Pelv Doppler  09/23/2013   CLINICAL DATA:  Right lower quadrant abdominal tenderness.  EXAM: TRANSABDOMINAL AND TRANSVAGINAL ULTRASOUND OF PELVIS  DOPPLER ULTRASOUND OF OVARIES  TECHNIQUE: Both transabdominal and transvaginal ultrasound examinations of the pelvis were performed. Transabdominal technique was performed for global imaging of the pelvis including uterus, ovaries, adnexal regions, and pelvic cul-de-sac.  It was necessary to proceed with endovaginal exam following the transabdominal exam to  visualize the uterus and ovaries in greater detail. Color and duplex Doppler ultrasound was utilized to evaluate blood flow to the ovaries.  COMPARISON:  None.  FINDINGS: Uterus  Measurements: 8.5 x 3.9 x 5.0 cm. No fibroids or other mass visualized.  Endometrium  Thickness: 0.4 cm.  No focal abnormality visualized.  Right ovary  Measurements: 3.8 x 2.2 x 1.9 cm. Normal appearance/no adnexal mass.  Left ovary  Measurements: 3.5 x 1.6 x 1.8 cm. Normal appearance/no adnexal mass.  Pulsed Doppler evaluation of both ovaries demonstrates normal low-resistance arterial and venous waveforms.  Other findings  A small amount of free fluid is seen within the pelvic cul-de-sac, likely physiologic in nature.  IMPRESSION: Unremarkable pelvic ultrasound.  No evidence for ovarian torsion.   Electronically Signed   By: Roanna Raider M.D.   On: 09/23/2013 22:56  EKG Interpretation None      MDM   Final diagnoses:  BV (bacterial vaginosis)  Pelvic pain in female    28 year old female with with one day of twisting abdominal pain in her periumbilical and left lower quadrant. Initial workup for rule out of ovarian torsion.   Labs unremarkable. Few clue cells seen on wet prep. With patient's friable cervix, mild adnexal tenderness and greenish/yellowish discharge we will treat patient here with IM Rocephin and by mouth azithromycin. We will give patient metronidazole outpatient for bacterial vaginosis noted on wet prep.  10:56 PM: Patient's pelvic ultrasound returns as being unremarkable with no evidence of ovarian torsion. Patient states her pain is better after treatment, and she is ready to go home.  Patient is nontoxic, nonseptic appearing, in no apparent distress.  Patient's pain and other symptoms adequately managed in emergency department.  Fluid bolus given.  Labs, imaging and vitals reviewed.  Patient does not meet the SIRS or Sepsis criteria.  On repeat exam patient does not have a surgical abdomin and  there are no peritoneal signs.  No indication of appendicitis, bowel obstruction, bowel perforation, cholecystitis, diverticulitis, PID, ovarian torsion or ectopic pregnancy.  Patient discharged home with symptomatic treatment and given strict instructions for follow-up with their primary care physician.  I have also discussed reasons to return immediately to the ER. Patient expresses understanding and agrees with plan.  BP 99/65  Pulse 71  Temp(Src) 98.9 F (37.2 C) (Oral)  Resp 16  Ht  (1.702 m)  Wt 159 lb (72.122 kg)  BMI 24.90 kg/m2  SpO2 100%  LMP 09/18/2013   Signed,  Ladona Mow, PA-C 2:35 AM   This patient discussed with Dr. Mirian Mo, MD.        Monte Fantasia, PA-C 09/24/13 0236

## 2013-09-23 NOTE — ED Notes (Signed)
Pelvic exam complete, Dr Rexanne Mano

## 2013-09-23 NOTE — ED Notes (Signed)
Pt c/o migraine headache that started this morning, sensitive to light and sounds. Also reports abd pain that started this morning as well, sharp, stabbing pains in the middle of her abd. Reports nausea and vomiting.

## 2013-09-24 LAB — GC/CHLAMYDIA PROBE AMP
CT Probe RNA: NEGATIVE
GC Probe RNA: NEGATIVE

## 2013-09-24 MED ORDER — HYDROCODONE-ACETAMINOPHEN 5-325 MG PO TABS: 1.0000 | ORAL_TABLET | Freq: Four times a day (QID) | ORAL | Status: DC | PRN

## 2013-09-24 MED ORDER — ONDANSETRON HCL 4 MG PO TABS: 4.0000 mg | ORAL_TABLET | Freq: Three times a day (TID) | ORAL | Status: DC | PRN

## 2013-09-24 MED ORDER — METRONIDAZOLE 500 MG PO TABS: 500.0000 mg | ORAL_TABLET | Freq: Two times a day (BID) | ORAL | Status: DC

## 2013-09-24 NOTE — Discharge Instructions (Signed)
Return to the ER if your pain persists, worsens or if you notice fever, increasing pain in your left lower abdomen, pelvis, vagina, vaginal bleeding, discharge, or if your symptoms persist or worsen. Followup with your primary care physician and OB/GYN. Take Flagyl 500 mg twice a day for bacterial vaginosis  Abdominal Pain, Women Abdominal (stomach, pelvic, or belly) pain can be caused by many things. It is important to tell your doctor:  The location of the pain.  Does it come and go or is it present all the time?  Are there things that start the pain (eating certain foods, exercise)?  Are there other symptoms associated with the pain (fever, nausea, vomiting, diarrhea)? All of this is helpful to know when trying to find the cause of the pain. CAUSES   Stomach: virus or bacteria infection, or ulcer.  Intestine: appendicitis (inflamed appendix), regional ileitis (Crohn's disease), ulcerative colitis (inflamed colon), irritable bowel syndrome, diverticulitis (inflamed diverticulum of the colon), or cancer of the stomach or intestine.  Gallbladder disease or stones in the gallbladder.  Kidney disease, kidney stones, or infection.  Pancreas infection or cancer.  Fibromyalgia (pain disorder).  Diseases of the female organs:  Uterus: fibroid (non-cancerous) tumors or infection.  Fallopian tubes: infection or tubal pregnancy.  Ovary: cysts or tumors.  Pelvic adhesions (scar tissue).  Endometriosis (uterus lining tissue growing in the pelvis and on the pelvic organs).  Pelvic congestion syndrome (female organs filling up with blood just before the menstrual period).  Pain with the menstrual period.  Pain with ovulation (producing an egg).  Pain with an IUD (intrauterine device, birth control) in the uterus.  Cancer of the female organs.  Functional pain (pain not caused by a disease, may improve without treatment).  Psychological pain.  Depression. DIAGNOSIS  Your  doctor will decide the seriousness of your pain by doing an examination.  Blood tests.  X-rays.  Ultrasound.  CT scan (computed tomography, special type of X-ray).  MRI (magnetic resonance imaging).  Cultures, for infection.  Barium enema (dye inserted in the large intestine, to better view it with X-rays).  Colonoscopy (looking in intestine with a lighted tube).  Laparoscopy (minor surgery, looking in abdomen with a lighted tube).  Major abdominal exploratory surgery (looking in abdomen with a large incision). TREATMENT  The treatment will depend on the cause of the pain.   Many cases can be observed and treated at home.  Over-the-counter medicines recommended by your caregiver.  Prescription medicine.  Antibiotics, for infection.  Birth control pills, for painful periods or for ovulation pain.  Hormone treatment, for endometriosis.  Nerve blocking injections.  Physical therapy.  Antidepressants.  Counseling with a psychologist or psychiatrist.  Minor or major surgery. HOME CARE INSTRUCTIONS   Do not take laxatives, unless directed by your caregiver.  Take over-the-counter pain medicine only if ordered by your caregiver. Do not take aspirin because it can cause an upset stomach or bleeding.  Try a clear liquid diet (broth or water) as ordered by your caregiver. Slowly move to a bland diet, as tolerated, if the pain is related to the stomach or intestine.  Have a thermometer and take your temperature several times a day, and record it.  Bed rest and sleep, if it helps the pain.  Avoid sexual intercourse, if it causes pain.  Avoid stressful situations.  Keep your follow-up appointments and tests, as your caregiver orders.  If the pain does not go away with medicine or surgery, you may try:  Acupuncture.  Relaxation exercises (yoga, meditation).  Group therapy.  Counseling. SEEK MEDICAL CARE IF:   You notice certain foods cause stomach  pain.  Your home care treatment is not helping your pain.  You need stronger pain medicine.  You want your IUD removed.  You feel faint or lightheaded.  You develop nausea and vomiting.  You develop a rash.  You are having side effects or an allergy to your medicine. SEEK IMMEDIATE MEDICAL CARE IF:   Your pain does not go away or gets worse.  You have a fever.  Your pain is felt only in portions of the abdomen. The right side could possibly be appendicitis. The left lower portion of the abdomen could be colitis or diverticulitis.  You are passing blood in your stools (bright red or black tarry stools, with or without vomiting).  You have blood in your urine.  You develop chills, with or without a fever.  You pass out. MAKE SURE YOU:   Understand these instructions.  Will watch your condition.  Will get help right away if you are not doing well or get worse. Document Released: 10/27/2006 Document Revised: 05/16/2013 Document Reviewed: 11/16/2008 [facility] Patient Information 2015 Patricia Hayes, Patricia Hayes. This information is not intended to replace advice given to you by your health care provider. Make sure you discuss any questions you have with your health care provider.    Bacterial Vaginosis Bacterial vaginosis is a vaginal infection that occurs when the normal balance of bacteria in the vagina is disrupted. It results from an overgrowth of certain bacteria. This is the most common vaginal infection in women of childbearing age. Treatment is important to prevent complications, especially in pregnant women, as it can cause a premature delivery. CAUSES  Bacterial vaginosis is caused by an increase in harmful bacteria that are normally present in smaller amounts in the vagina. Several different kinds of bacteria can cause bacterial vaginosis. However, the reason that the condition develops is not fully understood. RISK FACTORS Certain activities or behaviors can put you at an  increased risk of developing bacterial vaginosis, including:  Having a new sex partner or multiple sex partners.  Douching.  Using an intrauterine device (IUD) for contraception. Women do not get bacterial vaginosis from toilet seats, bedding, swimming pools, or contact with objects around them. SIGNS AND SYMPTOMS  Some women with bacterial vaginosis have no signs or symptoms. Common symptoms include:  Grey vaginal discharge.  A fishlike odor with discharge, especially after sexual intercourse.  Itching or burning of the vagina and vulva.  Burning or pain with urination. DIAGNOSIS  Your health care provider will take a medical history and examine the vagina for signs of bacterial vaginosis. A sample of vaginal fluid may be taken. Your health care provider will look at this sample under a microscope to check for bacteria and abnormal cells. A vaginal pH test may also be done.  TREATMENT  Bacterial vaginosis may be treated with antibiotic medicines. These may be given in the form of a pill or a vaginal cream. A second round of antibiotics may be prescribed if the condition comes back after treatment.  HOME CARE INSTRUCTIONS   Only take over-the-counter or prescription medicines as directed by your health care provider.  If antibiotic medicine was prescribed, take it as directed. Make sure you finish it even if you start to feel better.  Do not have sex until treatment is completed.  Tell all sexual partners that you have a vaginal infection. They  should see their health care provider and be treated if they have problems, such as a mild rash or itching.  Practice safe sex by using condoms and only having one sex partner. SEEK MEDICAL CARE IF:   Your symptoms are not improving after 3 days of treatment.  You have increased discharge or pain.  You have a fever. MAKE SURE YOU:   Understand these instructions.  Will watch your condition.  Will get help right away if you are not  doing well or get worse. FOR MORE INFORMATION  Centers for Disease Control and Prevention, Division of STD Prevention: [employer] American Sexual Health Association (ASHA): www.ashastd.org  Document Released: 12/30/2004 Document Revised: 10/20/2012 Document Reviewed: 08/11/2012 [facility] Patient Information 2015 [place], Patricia Hayes. This information is not intended to replace advice given to you by your health care provider. Make sure you discuss any questions you have with your health care provider.

## 2013-09-24 NOTE — ED Notes (Signed)
Patient is alert and orientedx4.  Patient was explained discharge instructions and they understood them with no questions.  The patient's nephew, Patricia Hayes is taking the patient home.

## 2013-09-25 NOTE — ED Provider Notes (Signed)
Medical screening examination/treatment/procedure(s) were conducted as a shared visit with non-physician practitioner(s) and myself.  I personally evaluated the patient during the encounter.   EKG Interpretation None       I performed an examination on the patient including cardiac, pulmonary, and gi systems which were unremarkable except as documented by PA.  She presented with periumbilical and LLQ tenderness with small amount of green discharge.  She was treated empirically for cervicitis and is to fu with her gynecologist.  DC home in stable condition.   Mirian Mo, MD 09/25/13 1520

## 2013-12-30 ENCOUNTER — Encounter: Payer: BC Managed Care – PPO | Admitting: Medical

## 2014-01-17 ENCOUNTER — Encounter: Payer: Self-pay | Admitting: Medical

## 2014-01-17 ENCOUNTER — Ambulatory Visit (INDEPENDENT_AMBULATORY_CARE_PROVIDER_SITE_OTHER): Payer: Medicaid Other | Admitting: Medical

## 2014-01-17 ENCOUNTER — Other Ambulatory Visit (HOSPITAL_COMMUNITY)
Admission: RE | Admit: 2014-01-17 | Discharge: 2014-01-17 | Disposition: A | Discharge status: Home / Self Care | Payer: Medicaid Other | Source: Ambulatory Visit | Attending: Medical | Admitting: Medical

## 2014-01-17 VITALS — BP 102/80 | HR 68 | Temp 98.5°F | Resp 16 | Ht 67.5 in | Wt 157.0 lb

## 2014-01-17 DIAGNOSIS — Z124 Encounter for screening for malignant neoplasm of cervix: Secondary | ICD-10-CM

## 2014-01-17 DIAGNOSIS — R110 Nausea: Secondary | ICD-10-CM

## 2014-01-17 DIAGNOSIS — G43809 Other migraine, not intractable, without status migrainosus: Secondary | ICD-10-CM | POA: Insufficient documentation

## 2014-01-17 DIAGNOSIS — Z1151 Encounter for screening for human papillomavirus (HPV): Secondary | ICD-10-CM | POA: Insufficient documentation

## 2014-01-17 DIAGNOSIS — Z0000 Encounter for general adult medical examination without abnormal findings: Secondary | ICD-10-CM

## 2014-01-17 DIAGNOSIS — L309 Dermatitis, unspecified: Secondary | ICD-10-CM | POA: Insufficient documentation

## 2014-01-17 DIAGNOSIS — Z01411 Encounter for gynecological examination (general) (routine) with abnormal findings: Secondary | ICD-10-CM | POA: Diagnosis not present

## 2014-01-17 DIAGNOSIS — F329 Major depressive disorder, single episode, unspecified: Secondary | ICD-10-CM

## 2014-01-17 DIAGNOSIS — Z23 Encounter for immunization: Secondary | ICD-10-CM | POA: Diagnosis not present

## 2014-01-17 DIAGNOSIS — Z113 Encounter for screening for infections with a predominantly sexual mode of transmission: Secondary | ICD-10-CM | POA: Insufficient documentation

## 2014-01-17 DIAGNOSIS — F17200 Nicotine dependence, unspecified, uncomplicated: Secondary | ICD-10-CM

## 2014-01-17 DIAGNOSIS — N926 Irregular menstruation, unspecified: Secondary | ICD-10-CM | POA: Insufficient documentation

## 2014-01-17 DIAGNOSIS — Z720 Tobacco use: Secondary | ICD-10-CM

## 2014-01-17 DIAGNOSIS — F32A Depression, unspecified: Secondary | ICD-10-CM

## 2014-01-17 LAB — LIPID PANEL
Cholesterol: 145 mg/dL (ref 0–200)
HDL: 69 mg/dL (ref >39)
LDL Cholesterol: 61 mg/dL (ref 0–99)
TRIGLYCERIDES: 76 mg/dL (ref <150)
Total CHOL/HDL Ratio: 2.1 Ratio
VLDL: 15 mg/dL (ref 0–40)

## 2014-01-17 LAB — CBC WITH DIFFERENTIAL/PLATELET
Basophils Absolute: 0 10*3/uL (ref 0.0–0.1)
Basophils Relative: 0 % (ref 0–1)
EOS ABS: 0.1 10*3/uL (ref 0.0–0.7)
Eosinophils Relative: 1 % (ref 0–5)
HEMATOCRIT: 39.5 % (ref 36.0–46.0)
Hemoglobin: 13.6 g/dL (ref 12.0–15.0)
LYMPHS ABS: 2.2 10*3/uL (ref 0.7–4.0)
Lymphocytes Relative: 23 % (ref 12–46)
MCH: 29.6 pg (ref 26.0–34.0)
MCHC: 34.4 g/dL (ref 30.0–36.0)
MCV: 85.9 fL (ref 78.0–100.0)
MONO ABS: 0.6 10*3/uL (ref 0.1–1.0)
MONOS PCT: 6 % (ref 3–12)
MPV: 9 fL (ref 8.6–12.4)
Neutro Abs: 6.7 10*3/uL (ref 1.7–7.7)
Neutrophils Relative %: 70 % (ref 43–77)
Platelets: 280 10*3/uL (ref 150–400)
RBC: 4.6 MIL/uL (ref 3.87–5.11)
RDW: 13.6 % (ref 11.5–15.5)
WBC: 9.6 10*3/uL (ref 4.0–10.5)

## 2014-01-17 LAB — POCT URINALYSIS DIPSTICK
BILIRUBIN UA: NEGATIVE
Glucose, UA: NEGATIVE
KETONES UA: NEGATIVE
Leukocytes, UA: NEGATIVE
NITRITE UA: NEGATIVE
Protein, UA: NEGATIVE
RBC UA: NEGATIVE
Spec Grav, UA: 1.02
Urobilinogen, UA: NEGATIVE
pH, UA: 7

## 2014-01-17 LAB — TSH: TSH: 1.011 u[IU]/mL (ref 0.350–4.500)

## 2014-01-17 LAB — POCT URINE PREGNANCY: PREG TEST UR: NEGATIVE

## 2014-01-17 MED ORDER — TRIAMCINOLONE ACETONIDE 0.1 % EX CREA: 1.0000 "application " | TOPICAL_CREAM | Freq: Two times a day (BID) | CUTANEOUS | Status: DC

## 2014-01-17 MED ORDER — SUMATRIPTAN SUCCINATE 25 MG PO TABS: ORAL_TABLET | ORAL | Status: DC

## 2014-01-17 MED ORDER — NORETHINDRONE ACET-ETHINYL EST 1-20 MG-MCG PO TABS: 1.0000 | ORAL_TABLET | Freq: Every day | ORAL | Status: DC

## 2014-01-17 MED ORDER — ONDANSETRON HCL 4 MG PO TABS: 4.0000 mg | ORAL_TABLET | Freq: Three times a day (TID) | ORAL | Status: DC | PRN

## 2014-01-17 MED ORDER — PAROXETINE HCL 20 MG PO TABS: ORAL_TABLET | ORAL | Status: DC

## 2014-01-17 NOTE — Progress Notes (Signed)
Subjective:   HPI  Patricia Hayes is a 29 y.o. female who presents for a complete physical.   Preventative care: Last ophthalmology visit:n/a Last dental visit:yes Last colonoscopy:2014 Last mammogram:n/a Last gynecological exam:01/17/14 Last EKG:n/a Last labs: ?  Prior vaccinations: TD or Tdap:unsure Influenza:declined flu vaccine Pneumococcal:n/a Shingles/Zostavax:n/a  Concerns: In addition to general physical she has several concerns today.  She ran out of the birth control recently and since then has had breakthrough bleeding/irregular period this month compared to usual.  But up to now she has done fine on the birth control which she uses for periods not contraception since she has had a tubal ligation  She has chronic back pain, low back pain, but not doing any particular exercise or stretching regularly  She has history of chronic nausea and belly pains, had a GI consult this past year with endoscopy.  She notes that she barely eats, doesn't have much of an appetite in general  Gets 1-2 headaches per week, migraine every 2 weeks on average. Medications I have prescribed in the past helped, not taking anything in particular currently  She continues to smoke half pack a day  She wants advice on how to prevent recurrent yeast and urinary tract infections.  Currently has an eczema flare up, sometimes uses lotion. The last prescription cream prescribed was too expensive so she never used any. this was prescribed at another office  Ran out of Paxil several months ago, it was helping someone with her mood but feels like she should probably restart the medication  She requests STD testing today, has had new sexual partner since last visit  Reviewed their medical, surgical, family, social, medication, and allergy history and updated chart as appropriate.  Past Medical History  Diagnosis Date  . Hiatal hernia   . History of urinary tract infection   . Anxiety   .  Depression     age 9yo  . GERD (gastroesophageal reflux disease)   . Wears contact lenses   . Eczema   . Uterine cyst     Past Surgical History  Procedure Laterality Date  . Tubal ligation    . Esophagogastroduodenoscopy  2014    hiatal hernia; Dr. Elnoria Howard    History   Social History  . Marital Status: Single    Spouse Name: N/A    Number of Children: N/A  . Years of Education: N/A   Occupational History  . Not on file.   Social History Main Topics  . Smoking status: Current Every Day Smoker -- 0.50 packs/day for 15 years    Types: Cigarettes  . Smokeless tobacco: Not on file  . Alcohol Use: Yes     Comment: occasional  . Drug Use: No  . Sexual Activity: Yes    Birth Control/ Protection: Surgical   Other Topics Concern  . Not on file   Social History Narrative   Lives at home with her 3 kids, niece, nephew, and sister.  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  Exercise - not much.  Diet - no discretion, "barely eating" 01/2014.    Family History  Problem Relation Age of Onset  . Hypertension Other   . Diabetes Other   . Hypertension Mother   . Cancer Mother     uterine  . Other Father     unknown  . Cancer Sister     cervical  . Hypertension Brother   . Hyperlipidemia Brother   . Diabetes  Brother   . Cancer Maternal Aunt     breast  . Stroke Maternal Grandfather   . Heart disease Maternal Grandfather   . Depression Sister     Current outpatient prescriptions: norethindrone-ethinyl estradiol (MICROGESTIN) 1-20 MG-MCG tablet, Take 1 tablet by mouth daily., Disp: 1 Package, Rfl: 11;  ondansetron (ZOFRAN) 4 MG tablet, Take 1 tablet (4 mg total) by mouth every 8 (eight) hours as needed for nausea or vomiting., Disp: 20 tablet, Rfl: 0;  PARoxetine (PAXIL) 20 MG tablet, 1/2 tablet po daily x 1wk, then 1 tablet daily in the morning, Disp: 30 tablet, Rfl: 1 SUMAtriptan (IMITREX) 25 MG tablet, May repeat in 2 hours if headache persists or  recurs..  Max 2 daily, Disp: 10 tablet, Rfl: 2;  triamcinolone cream (KENALOG) 0.1 %, Apply 1 application topically 2 (two) times daily., Disp: 45 g, Rfl: 0  No Known Allergies     Review of Systems Constitutional: -fever, -chills, -sweats, -unexpected weight change, -decreased appetite, -fatigue Allergy: -sneezing, -itching, -congestion Dermatology: -changing moles,     +rash, -lumps ENT: -runny nose, -ear pain, -sore throat, -hoarseness, -sinus pain, -teeth pain, - ringing in ears, -hearing loss, -nosebleeds Cardiology: -chest pain, -palpitations, -swelling, -difficulty breathing when lying flat, -waking up short of breath Respiratory: -cough, -shortness of breath, -difficulty breathing with exercise or exertion, -wheezing, -coughing up blood Gastroenterology: +abdominal pain, +nausea, +vomiting, -diarrhea, -constipation, -blood in stool, -changes in bowel movement, -difficulty swallowing or eating Hematology: -bleeding, -bruising  Musculoskeletal: -joint aches, -muscle aches, -joint swelling, +back pain, -neck pain, -cramping, -changes in gait Ophthalmology: denies vision changes, eye redness, itching, discharge Urology: -burning with urination, -difficulty urinating, -blood in urine, -urinary frequency, -urgency, -incontinence Neurology: -headache, -weakness, -tingling, -numbness, -memory loss, -falls, -dizziness Psychology: -depressed mood, -agitation, -sleep problems     Objective:   Physical Exam  BP 102/80 mmHg  Pulse 68  Temp(Src) 98.5 F (36.9 C) (Oral)  Resp 16  Ht 5' 7.5" (1.715 m)  Wt 157 lb (71.215 kg)  BMI 24.21 kg/m2  General appearance: alert, no distress, WD/WN, AA female, pleasant, smiling most of the visit compared to sometimes depressed affect Skin: several patches of dry skin/rough patches on bilat forearms, right superior hip region c/w eczema, few scattered macules, tattoos of right upper chest, right lower leg HEENT: normocephalic, conjunctiva/corneas  normal, sclerae anicteric, PERRLA, EOMi, nares patent, no discharge or erythema, pharynx normal Oral cavity: MMM, tongue normal, teeth with moderate plaque, otherwise unremarkable Neck: supple, no lymphadenopathy, no thyromegaly, no masses, normal ROM, no bruits Chest: non tender, normal shape and expansion Heart: RRR, normal S1, S2, no murmurs Lungs: CTA bilaterally, no wheezes, rhonchi, or rales Abdomen: +bs, soft, non tender, non distended, no masses, no hepatomegaly, no splenomegaly, no bruits Back: non tender, normal ROM, no scoliosis Musculoskeletal: upper extremities non tender, no obvious deformity, normal ROM throughout, lower extremities non tender, no obvious deformity, normal ROM throughout Extremities: no edema, no cyanosis, no clubbing Pulses: 2+ symmetric, upper and lower extremities, normal cap refill Neurological: alert, oriented x 3, CN2-12 intact, strength normal upper extremities and lower extremities, sensation normal throughout, DTRs 2+ throughout, no cerebellar signs, gait normal Psychiatric: normal affect, behavior normal, pleasant  Breast: nontender, no masses or lumps, no skin changes, no nipple discharge or inversion, no axillary lymphadenopathy Gyn: Normal external genitalia without lesions, vagina with normal mucosa, cervix with slight blood from os, no cervical motion tenderness, no abnormal vaginal discharge.  Uterus and adnexa not enlarged, nontender, no masses.  Pap performed.  Exam chaperoned by nurse. Rectal: anus normal appearing    Assessment and Plan :    Encounter Diagnoses  Name Primary?  . Encounter for health maintenance examination in adult Yes  . Screen for STD (sexually transmitted disease)   . Screening for cervical cancer   . Need for prophylactic vaccination and inoculation against influenza   . Need for Tdap vaccination   . Need for prophylactic vaccination against Streptococcus pneumoniae (pneumococcus)   . Smoker   . Other migraine  without status migrainosus, not intractable   . Chronic nausea   . Depression   . Irregular periods   . Eczema     Physical exam - discussed healthy lifestyle, diet, exercise, preventative care, vaccinations, and addressed their concerns.  Handout given. STD screening today, discussed safe sex Pap sent for cervical cancer screening Routine labs today Counseled on the influenza virus vaccine.  Vaccine information sheet given.  Influenza vaccine given after consent obtained. Counseled on the Tdap (tetanus, diptheria, and acellular pertussis) vaccine.  Vaccine information sheet given. Tdap vaccine given after consent obtained. Counseled on the pneumococcal vaccine.  Vaccine information sheet given.  Pneumococcal vaccine/PPSV23 given after consent obtained. Smoking - encouraged her to consider tobacco cessation.  Not interested in quitting at this time migraines - avoid triggers, can use Imitrex prn.  Refilled medications, has done ok on this prior Chronic nausea - will refer GI consult notes, can use Zofran prn Depression - restart Paxil.  Recheck 3-4 wk. Irregular periods - had done well on OCP.  Restart OCP.  Of note she has had tubal ligation.  Discussed risk/benefits of medication Eczema - daily moisturizing lotion recommended, script for triamcinolone for worse flares as discussed Follow-up pending labs

## 2014-01-18 LAB — HEMOGLOBIN A1C
Hgb A1c MFr Bld: 5.5 % (ref <5.7)
Mean Plasma Glucose: 111 mg/dL (ref <117)

## 2014-01-18 LAB — HIV ANTIBODY (ROUTINE TESTING W REFLEX): HIV: NONREACTIVE

## 2014-01-18 LAB — RPR

## 2014-01-19 LAB — CYTOLOGY - PAP

## 2014-01-20 ENCOUNTER — Encounter: Payer: Self-pay | Admitting: Family Medicine

## 2014-07-06 ENCOUNTER — Encounter: Payer: Self-pay | Admitting: Medical

## 2014-07-06 ENCOUNTER — Ambulatory Visit (INDEPENDENT_AMBULATORY_CARE_PROVIDER_SITE_OTHER): Payer: Medicaid Other | Admitting: Medical

## 2014-07-06 VITALS — BP 134/80 | HR 80 | Temp 98.3°F | Resp 16 | Wt 169.0 lb

## 2014-07-06 DIAGNOSIS — N941 Dyspareunia: Secondary | ICD-10-CM

## 2014-07-06 DIAGNOSIS — Z202 Contact with and (suspected) exposure to infections with a predominantly sexual mode of transmission: Secondary | ICD-10-CM

## 2014-07-06 DIAGNOSIS — Z9851 Tubal ligation status: Secondary | ICD-10-CM

## 2014-07-06 DIAGNOSIS — N930 Postcoital and contact bleeding: Secondary | ICD-10-CM | POA: Diagnosis not present

## 2014-07-06 DIAGNOSIS — N926 Irregular menstruation, unspecified: Secondary | ICD-10-CM | POA: Diagnosis not present

## 2014-07-06 DIAGNOSIS — IMO0002 Reserved for concepts with insufficient information to code with codable children: Secondary | ICD-10-CM

## 2014-07-06 LAB — POCT WET PREP (WET MOUNT)
Clue Cells Wet Prep Whiff POC: POSITIVE
KOH Wet Prep POC: NEGATIVE
Trichomonas Wet Prep HPF POC: NEGATIVE
WBC, Wet Prep HPF POC: NEGATIVE

## 2014-07-06 MED ORDER — METRONIDAZOLE 500 MG PO TABS: 500.0000 mg | ORAL_TABLET | Freq: Two times a day (BID) | ORAL | Status: DC

## 2014-07-06 MED ORDER — FLUCONAZOLE 150 MG PO TABS: ORAL_TABLET | ORAL | Status: DC

## 2014-07-06 NOTE — Progress Notes (Signed)
Subjective: Here for STD screen, 49mo f/u on last STD screen.   Been having some low back pain, and in the past with low back pain had BV.  Does get some spotting occasional after intercourse.  Sometimes has pain with intercourse.  Is lubricated well.  Her fiance has a large penis, not particularly rough sex.   No burning with urination, no urine odor, no blood in urine.   No vaginal discharge. LMP end of May.   Has had tubal ligation.   Is on birth control for irregular periods, and this medication works fine.  Last STD testing here in January and pap was normal then with HPV.  She only has 1 new partner, current partner since January 2016.  She stopped her antidepressant a while back.   Actually doing well, in new relationship, feels happy, no particular worries.  No other aggravating or relieving factors. No other complaint.  Past Medical History  Diagnosis Date  . Hiatal hernia   . History of urinary tract infection   . Anxiety   . Depression     age 29yo  . GERD (gastroesophageal reflux disease)   . Wears contact lenses   . Eczema   . Uterine cyst    ROS as in subjective  Objective: BP 134/80 mmHg  Pulse 80  Temp(Src) 98.3 F (36.8 C) (Oral)  Resp 16  Wt 169 lb (76.658 kg)  Gen: wd, wn, nad Abdomen: +bs soft, mild RLQ and suprapubic tendnerss, no mass, no organomegaly Gyn: Normal external genitalia without lesions, vagina with normal mucosa, cervix with mild erythema, no cervical motion tenderness, no abnormal vaginal discharge.  Uterus and adnexa not enlarged, nontender, no masses.  Pap performed.  Exam chaperoned by nurse. Rectal: deferred    Assessment: Encounter Diagnoses  Name Primary?  . Dyspareunia Yes  . Venereal disease contact   . PCB (post coital bleeding)   . Irregular periods   . H/O tubal ligation     Plan: We discussed her concerns.  Wet prep with clue cells and several yeasts.   Labs sent.  F/u pending labs. Of note she has had tubal ligation, and  periods are controled on OCPs. Was having cramping irregular periods prior.  Cambri was seen today for std check.  Diagnoses and all orders for this visit:  Dyspareunia Orders: -     HIV antibody -     RPR -     GC/Chlamydia Probe Amp -     POCT Wet Prep ([facility] [place])  Venereal disease contact Orders: -     HIV antibody -     RPR -     GC/Chlamydia Probe Amp -     POCT Wet Prep ([place] [place])  PCB (post coital bleeding) Orders: -     HIV antibody -     RPR -     GC/Chlamydia Probe Amp -     POCT Wet Prep ([place] [place])  Irregular periods  H/O tubal ligation  Other orders -     metroNIDAZOLE (FLAGYL) 500 MG tablet; Take 1 tablet (500 mg total) by mouth 2 (two) times daily. don't consume alcohol while taking the medication -     fluconazole (DIFLUCAN) 150 MG tablet; One tablet now, repeat in 1 week

## 2014-07-07 LAB — GC/CHLAMYDIA PROBE AMP
CT Probe RNA: NEGATIVE
GC Probe RNA: NEGATIVE

## 2014-07-07 LAB — HIV ANTIBODY (ROUTINE TESTING W REFLEX): HIV: NONREACTIVE

## 2014-07-07 LAB — RPR

## 2015-01-16 ENCOUNTER — Encounter (HOSPITAL_COMMUNITY): Payer: Self-pay | Admitting: Emergency Medicine

## 2015-01-16 ENCOUNTER — Emergency Department (HOSPITAL_COMMUNITY): Payer: 59

## 2015-01-16 ENCOUNTER — Emergency Department (HOSPITAL_COMMUNITY)
Admission: EM | Admit: 2015-01-16 | Discharge: 2015-01-16 | Disposition: A | Discharge status: Home / Self Care | Payer: 59 | Attending: Emergency Medicine | Admitting: Emergency Medicine

## 2015-01-16 DIAGNOSIS — Z7952 Long term (current) use of systemic steroids: Secondary | ICD-10-CM | POA: Insufficient documentation

## 2015-01-16 DIAGNOSIS — Z8719 Personal history of other diseases of the digestive system: Secondary | ICD-10-CM | POA: Insufficient documentation

## 2015-01-16 DIAGNOSIS — Z87440 Personal history of urinary (tract) infections: Secondary | ICD-10-CM | POA: Diagnosis not present

## 2015-01-16 DIAGNOSIS — Z79899 Other long term (current) drug therapy: Secondary | ICD-10-CM | POA: Diagnosis not present

## 2015-01-16 DIAGNOSIS — R110 Nausea: Secondary | ICD-10-CM | POA: Diagnosis not present

## 2015-01-16 DIAGNOSIS — R0781 Pleurodynia: Secondary | ICD-10-CM | POA: Diagnosis present

## 2015-01-16 DIAGNOSIS — Z8659 Personal history of other mental and behavioral disorders: Secondary | ICD-10-CM | POA: Diagnosis not present

## 2015-01-16 DIAGNOSIS — Z973 Presence of spectacles and contact lenses: Secondary | ICD-10-CM | POA: Insufficient documentation

## 2015-01-16 DIAGNOSIS — Z792 Long term (current) use of antibiotics: Secondary | ICD-10-CM | POA: Insufficient documentation

## 2015-01-16 DIAGNOSIS — Z793 Long term (current) use of hormonal contraceptives: Secondary | ICD-10-CM | POA: Diagnosis not present

## 2015-01-16 DIAGNOSIS — Z8742 Personal history of other diseases of the female genital tract: Secondary | ICD-10-CM | POA: Insufficient documentation

## 2015-01-16 DIAGNOSIS — R0789 Other chest pain: Secondary | ICD-10-CM | POA: Diagnosis not present

## 2015-01-16 DIAGNOSIS — F17210 Nicotine dependence, cigarettes, uncomplicated: Secondary | ICD-10-CM | POA: Insufficient documentation

## 2015-01-16 DIAGNOSIS — L309 Dermatitis, unspecified: Secondary | ICD-10-CM | POA: Diagnosis not present

## 2015-01-16 MED ORDER — NAPROXEN 500 MG PO TABS: 500.0000 mg | ORAL_TABLET | Freq: Two times a day (BID) | ORAL | Status: DC

## 2015-01-16 MED ORDER — HYDROCODONE-ACETAMINOPHEN 5-325 MG PO TABS: 1.0000 | ORAL_TABLET | Freq: Four times a day (QID) | ORAL | Status: DC | PRN

## 2015-01-16 MED ORDER — PREDNISONE 20 MG PO TABS: 40.0000 mg | ORAL_TABLET | Freq: Every day | ORAL | Status: DC

## 2015-01-16 NOTE — ED Notes (Signed)
Pt to xray

## 2015-01-16 NOTE — Discharge Instructions (Signed)
Take naproxen for pain and Norco if naproxen does not adequately control your pain. You have been prescribed prednisone for your eczema. Be sure to moisturize frequently, 2-3 times per day. Follow up with your primary care doctor for further evaluation of your symptoms, especially if they persist.  Chest Wall Pain Chest wall pain is pain in or around the bones and muscles of your chest. Sometimes, an injury causes this pain. Sometimes, the cause may not be known. This pain may take several weeks or longer to get better. HOME CARE INSTRUCTIONS  Pay attention to any changes in your symptoms. Take these actions to help with your pain:   Rest as told by your health care provider.   Avoid activities that cause pain. These include any activities that use your chest muscles or your abdominal and side muscles to lift heavy items.   If directed, apply ice to the painful area:  Put ice in a plastic bag.  Place a towel between your skin and the bag.  Leave the ice on for 20 minutes, 2-3 times per day.  Take over-the-counter and prescription medicines only as told by your health care provider.  Do not use tobacco products, including cigarettes, chewing tobacco, and e-cigarettes. If you need help quitting, ask your health care provider.  Keep all follow-up visits as told by your health care provider. This is important. SEEK MEDICAL CARE IF:  You have a fever.  Your chest pain becomes worse.  You have new symptoms. SEEK IMMEDIATE MEDICAL CARE IF:  You have nausea or vomiting.  You feel sweaty or light-headed.  You have a cough with phlegm (sputum) or you cough up blood.  You develop shortness of breath.   This information is not intended to replace advice given to you by your health care provider. Make sure you discuss any questions you have with your health care provider.   Document Released: 12/30/2004 Document Revised: 09/20/2014 Document Reviewed: 03/27/2014 Elsevier Interactive  Patient Education 2016 Else[employer] Inc[employer] Eczema Eczema, also called atopic dermatitis, is a skin disorder that causes inflammation of the skin. It causes a red rash and dry, scaly skin. The skin becomes very itchy. Eczema is generally worse during the cooler winter months and often improves with the warmth of summer. Eczema usually starts showing signs in infancy. Some children outgrow eczema, but it may last through adulthood.  CAUSES  The exact cause of eczema is not known, but it appears to run in families. People with eczema often have a family history of eczema, allergies, asthma, or hay fever. Eczema is not contagious. Flare-ups of the condition may be caused by:   Contact with something you are sensitive or allergic to.   Stress. SIGNS AND SYMPTOMS  Dry, scaly skin.   Red, itchy rash.   Itchiness. This may occur before the skin rash and may be very intense.  DIAGNOSIS  The diagnosis of eczema is usually made based on symptoms and medical history. TREATMENT  Eczema cannot be cured, but symptoms usually can be controlled with treatment and other strategies. A treatment plan might include:  Controlling the itching and scratching.   Use over-the-counter antihistamines as directed for itching. This is especially useful at night when the itching tends to be worse.   Use over-the-counter steroid creams as directed for itching.   Avoid scratching. Scratching makes the rash and itching worse. It may also result in a skin infection (impetigo) due to a break in the skin caused by scratching.  Keeping the skin well moisturized with creams every day. This will seal in moisture and help prevent dryness. Lotions that contain alcohol and water should be avoided because they can dry the skin.   Limiting exposure to things that you are sensitive or allergic to (allergens).   Recognizing situations that cause stress.   Developing a plan to manage stress.  HOME CARE INSTRUCTIONS    Only take over-the-counter or prescription medicines as directed by your health care provider.   Do not use anything on the skin without checking with your health care provider.   Keep baths or showers short (5 minutes) in warm (not hot) water. Use mild cleansers for bathing. These should be unscented. You may add nonperfumed bath oil to the bath water. It is best to avoid soap and bubble bath.   Immediately after a bath or shower, when the skin is still damp, apply a moisturizing ointment to the entire body. This ointment should be a petroleum ointment. This will seal in moisture and help prevent dryness. The thicker the ointment, the better. These should be unscented.   Keep fingernails cut short. Children with eczema may need to wear soft gloves or mittens at night after applying an ointment.   Dress in clothes made of cotton or cotton blends. Dress lightly, because heat increases itching.   A child with eczema should stay away from anyone with fever blisters or cold sores. The virus that causes fever blisters (herpes simplex) can cause a serious skin infection in children with eczema. SEEK MEDICAL CARE IF:   Your itching interferes with sleep.   Your rash gets worse or is not better within 1 week after starting treatment.   You see pus or soft yellow scabs in the rash area.   You have a fever.   You have a rash flare-up after contact with someone who has fever blisters.    This information is not intended to replace advice given to you by your health care provider. Make sure you discuss any questions you have with your health care provider.   Document Released: 12/28/1999 Document Revised: 10/20/2012 Document Reviewed: 08/02/2012 Elsevier Interactive Patient Education [employer]2016 Else[employer] Inc[employer]

## 2015-01-16 NOTE — ED Notes (Signed)
Pt states that she has lower R rib pain that is worse with palpation and movement/coughing. Pt also has scabbed broken patch on her L palm and L foot. Alert and oriented.

## 2015-01-16 NOTE — ED Notes (Signed)
Pt presents with c/o right sharp rib pain that she believes is muscular in origin. Pt states pain is worse when she bends, breathes deep, or coughs

## 2015-01-16 NOTE — ED Provider Notes (Signed)
History  By signing my name below, I, Patricia Hayes, attest that this documentation has been prepared under the direction and in the presence of [employer], PA-C. Electronically Signed: Karle Hayes, ED Scribe. 01/16/2015. 9:48 PM.  Chief Complaint  Patient presents with  . Rib pain    The history is provided by the patient and medical records. No language interpreter was used.    HPI Comments:  Patricia Hayes is a 30 y.o. female who presents to the Emergency Department complaining of moderate, aching, intermittent right-sided rib pain that began approximately three days ago. She reports associated nausea. She has taken Aleve with minimal relief of the pain. Moving, coughing, sneezing and laughing increase the pain. She denies alleviating factors. She denies fever, chills, leg swelling or vomiting. She denies any surgeries or hospitalizations in the past 6 months. She denies PMHx of DVT or PE. She denies any recent travel. Pt denies trauma, injury or fall. Pt states she does have a PCP. Pt also complains of discoloration to there left palm and bilateral feet that has been present for several months. She reports associated itching and peeling of the areas. She states she has been applying a cream that her mother gave her that helped with the itching. She denies modifying factors. Pt reports PMHx of eczema.  Past Medical History  Diagnosis Date  . Hiatal hernia   . History of urinary tract infection   . Anxiety   . Depression     age 46yo  . GERD (gastroesophageal reflux disease)   . Wears contact lenses   . Eczema   . Uterine cyst    Past Surgical History  Procedure Laterality Date  . Tubal ligation    . Esophagogastroduodenoscopy  2014    hiatal hernia; Dr. Elnoria Howard   Family History  Problem Relation Age of Onset  . Hypertension Other   . Diabetes Other   . Hypertension Mother   . Cancer Mother     uterine  . Other Father     unknown  . Cancer Sister     cervical  .  Hypertension Brother   . Hyperlipidemia Brother   . Diabetes Brother   . Cancer Maternal Aunt     breast  . Stroke Maternal Grandfather   . Heart disease Maternal Grandfather   . Depression Sister    Social History  Substance Use Topics  . Smoking status: Current Every Day Smoker -- 0.50 packs/day for 15 years    Types: Cigarettes  . Smokeless tobacco: None  . Alcohol Use: Yes     Comment: occasional   OB History    No data available      Review of Systems  Gastrointestinal: Positive for nausea.  Musculoskeletal: Positive for myalgias.  Skin: Positive for color change.  All other systems reviewed and are negative.   Allergies  Review of patient's allergies indicates no known allergies.  Home Medications   Prior to Admission medications   Medication Sig Start Date End Date Taking? Authorizing Provider  fluconazole (DIFLUCAN) 150 MG tablet One tablet now, repeat in 1 week 07/06/14   Kermit Balo Tysinger, PA-C  metroNIDAZOLE (FLAGYL) 500 MG tablet Take 1 tablet (500 mg total) by mouth 2 (two) times daily. don't consume alcohol while taking the medication 07/06/14   Kermit Balo Tysinger, PA-C  norethindrone-ethinyl estradiol (MICROGESTIN) 1-20 MG-MCG tablet Take 1 tablet by mouth daily. 01/17/14   Kermit Balo Tysinger, PA-C  ondansetron (ZOFRAN) 4 MG tablet Take 1 tablet (  4 mg total) by mouth every 8 (eight) hours as needed for nausea or vomiting. 01/17/14   Kermit Baloavid S Tysinger, PA-C  PARoxetine (PAXIL) 20 MG tablet 1/2 tablet po daily x 1wk, then 1 tablet daily in the morning Patient not taking: Reported on 07/06/2014 01/17/14   Kermit Baloavid S Tysinger, PA-C  SUMAtriptan (IMITREX) 25 MG tablet May repeat in 2 hours if headache persists or recurs..  Max 2 daily 01/17/14   Kermit Baloavid S Tysinger, PA-C  triamcinolone cream (KENALOG) 0.1 % Apply 1 application topically 2 (two) times daily. 01/17/14   Jac Canavanavid S Tysinger, PA-C   Triage Vitals: BP 121/74 mmHg  Pulse 78  Temp(Src) 98.3 F (36.8 C) (Oral)  Resp 16  Ht 5'  7" (1.702 m)  Wt 170 lb (77.111 kg)  BMI 26.62 kg/m2  SpO2 99%  LMP 01/02/2015 (Approximate)  Physical Exam  Constitutional: She is oriented to person, place, and time. She appears well-developed and well-nourished. No distress.  Nontoxic/nonseptic appearing. Talking on cell phone.  HENT:  Head: Normocephalic and atraumatic.  Eyes: Conjunctivae and EOM are normal. No scleral icterus.  Neck: Normal range of motion.  Cardiovascular: Normal rate, regular rhythm and intact distal pulses.   Pulmonary/Chest: Effort normal. No respiratory distress. She has no wheezes. She has no rales. She exhibits tenderness and bony tenderness. She exhibits no mass, no crepitus and no deformity.    Tenderness to palpation to the anterior lower chest wall over a rib. No bony deformity or crepitus. Chest expansion symmetric. Lungs sounds clear in all lung fields.  Abdominal: Soft. She exhibits no distension.  Negative Murphy sign. Soft abdomen. No distention or peritoneal signs.  Musculoskeletal: Normal range of motion.  No lower extremity edema  Neurological: She is alert and oriented to person, place, and time.  Skin: Skin is warm and dry. Rash noted. She is not diaphoretic. No erythema. No pallor.  Maculopapular rash patch noted to L lateral foot and L palm; pruritic. No skin peeling, weeping, drainage, heat to touch, or induration.  Psychiatric: She has a normal mood and affect. Her behavior is normal.  Nursing note and vitals reviewed.   ED Course  Procedures (including critical care time) DIAGNOSTIC STUDIES: Oxygen Saturation is 99% on RA, normal by my interpretation.   COORDINATION OF CARE: 9:46 PM- Will prescribe steroid cream for skin. Will order CXR. Pt verbalizes understanding and agrees to plan.  Medications - No data to display  Imaging Review Dg Ribs Unilateral W/chest Right  01/16/2015  CLINICAL DATA:  Rolled over in bed 3 days ago, and felt right anterior mid to lower rib pain.  Initial encounter. EXAM: RIGHT RIBS AND CHEST - 3+ VIEW COMPARISON:  Chest radiograph performed 08/24/2012 FINDINGS: No displaced rib fractures are seen. The lungs are well-aerated and clear. There is no evidence of focal opacification, pleural effusion or pneumothorax. The cardiomediastinal silhouette is within normal limits. No acute osseous abnormalities are seen. IMPRESSION: No displaced rib fracture seen. No acute cardiopulmonary process identified. Electronically Signed   By: Roanna RaiderJeffery  Chang M.D.   On: 01/16/2015 22:10     I have personally reviewed and evaluated these images and lab results as part of my medical decision-making.  10:01 PM Patient PERC negative; doubt PE as cause of symptoms MDM   Final diagnoses:  Right-sided chest wall pain  Eczema    30 year old female presents to the emergency department for evaluation of atraumatic right-sided anterior chest wall pain. Patient with palpable, bony tenderness. No crepitus or  deformity noted. Chest x-ray negative for rib fracture. No pneumothorax or focal consolidation. Doubt pulmonary embolus as cause of symptoms. Will discharge with instructions for supportive care. Patient referred to her primary care doctor for follow-up. Patient also given prednisone taper for presumed eczema patch to her left hand and foot. Return precautions given at discharge. Patient discharged in good condition with no unaddressed concerns.  I personally performed the services described in this documentation, which was scribed in my presence. The recorded information has been reviewed and is accurate.    Filed Vitals:   01/16/15 2115 01/16/15 2237  BP: 121/74 120/67  Pulse: 78 86  Temp: 98.3 F (36.8 C)   TempSrc: Oral   Resp: 16 16  Height: 5\' 7"  (1.702 m)   Weight: 77.111 kg   SpO2: 99% 99%      Antony Madura, PA-C 01/16/15 2244  Vanetta Mulders, MD 01/18/15 2007

## 2015-04-18 ENCOUNTER — Ambulatory Visit (INDEPENDENT_AMBULATORY_CARE_PROVIDER_SITE_OTHER): Payer: 59 | Admitting: Medical

## 2015-04-18 ENCOUNTER — Encounter: Payer: Self-pay | Admitting: Medical

## 2015-04-18 VITALS — BP 98/68 | HR 90 | Wt 170.0 lb

## 2015-04-18 DIAGNOSIS — L309 Dermatitis, unspecified: Secondary | ICD-10-CM

## 2015-04-18 DIAGNOSIS — R109 Unspecified abdominal pain: Secondary | ICD-10-CM | POA: Diagnosis not present

## 2015-04-18 LAB — POCT URINALYSIS DIPSTICK
Bilirubin, UA: NEGATIVE
Blood, UA: NEGATIVE
Glucose, UA: NEGATIVE
KETONES UA: NEGATIVE
Leukocytes, UA: NEGATIVE
Nitrite, UA: NEGATIVE
PH UA: 7.5
PROTEIN UA: NEGATIVE
Spec Grav, UA: 1.02
Urobilinogen, UA: NEGATIVE

## 2015-04-18 MED ORDER — TRIAMCINOLONE ACETONIDE 0.1 % EX CREA: 1.0000 "application " | TOPICAL_CREAM | Freq: Two times a day (BID) | CUTANEOUS | Status: DC

## 2015-04-18 NOTE — Progress Notes (Signed)
Subjective: Chief Complaint  Patient presents with  . Abdominal Pain    about 2 weeks. lower abdominal area.said this morning the pain was so severe she couldnt stnad up. first started as if it was menstural cramps but she just go off of her period. her excema is flaring up so wants a refill on the cream   Here for abdominal pain, in left lower abdomen x 2 week. This morning was worse.   Having some headache.  Pain seemed like menstrual cramps initially, but today worse.   Pain is somewhat constant but has flare ups.  No burning with urination, has some urgency some frequency.   No urine odor.  No fever.   No vaginal discharge.  Last menstrual period 03/30/15.  Has had some lower back pain, so has used some cranberry juice.   Had been on OCPs due to irregular period, but stopped OCP since periods seem to get regulated.  Stopped OCPs 6 months ago.   Does get some heavy clots with periods occasionally. Been having regular BMs, once daily at least.  Did use laxative yesterday just in case the pain was due to stool backed up.  Has had some nausea, no vomiting.  No concern for STD, no new partner.  No other aggravating or relieving factors.   Also needs cream refilled for eczema.  Having flare ups on hands nad feet, and ran out of triamcinolone.  No other complaint.  Past Medical History  Diagnosis Date  . Hiatal hernia   . History of urinary tract infection   . Anxiety   . Depression     age 30yo  . GERD (gastroesophageal reflux disease)   . Wears contact lenses   . Eczema   . Uterine cyst    Past Surgical History  Procedure Laterality Date  . Tubal ligation    . Esophagogastroduodenoscopy  2014    hiatal hernia; Dr. Elnoria HowardHung    ROS as in subjective   Objective: BP 98/68 mmHg  Pulse 90  Wt 170 lb (77.111 kg)  LMP 03/30/2015  Wt Readings from Last 3 Encounters:  04/18/15 170 lb (77.111 kg)  01/16/15 170 lb (77.111 kg)  07/06/14 169 lb (76.658 kg)   Repeat BP by me in right arm was  108/76  General appearance: alert, no distress, WD/WN, lying on exam table Oral cavity: MMM, no lesions Back: nontender Abdomen: +bs, soft, LLQ tenderness, otherwise non tender, non distended, no masses, no hepatomegaly, no splenomegaly Pulses: 2+ symmetric, upper and lower extremities, normal cap refill Ext: no edema Skin: pink/red rough rash on left palm, similar pink rash and dry skin of right lateral heel and volar right foot    Assessment: Encounter Diagnoses  Name Primary?  . Abdominal pain, unspecified abdominal location Yes  . Eczema      Plan: Abdominal pain - s/p tubal ligation.  Will send for urine culture, GC/Chlamydia.  Etiology unclear, but possible UTI given urine findings and symptoms.   If tests normal, consider ultrasound given hx/o ovarian cysts.    Eczema - c/t daily moisturizing lotion, can use Vaseline on the foot, refilled prn use of Triamcinolone for up to 10 days for flare up.  Will check into compounded eczema cream at [facility].    F/u pending labs

## 2015-04-19 ENCOUNTER — Telehealth: Payer: Self-pay

## 2015-04-19 LAB — GC/CHLAMYDIA PROBE AMP
CT Probe RNA: NOT DETECTED
GC Probe RNA: NOT DETECTED

## 2015-04-19 NOTE — Telephone Encounter (Signed)
Called pt to let her know the excema cream would be 33 dollars a month she declined and said she got something OTC

## 2015-04-20 LAB — URINE CULTURE
COLONY COUNT: NO GROWTH
Organism ID, Bacteria: NO GROWTH

## 2015-05-23 ENCOUNTER — Encounter (HOSPITAL_COMMUNITY): Payer: Self-pay | Admitting: Emergency Medicine

## 2015-05-23 ENCOUNTER — Emergency Department (HOSPITAL_COMMUNITY)
Admission: EM | Admit: 2015-05-23 | Discharge: 2015-05-23 | Disposition: A | Discharge status: Home / Self Care | Payer: 59 | Attending: Emergency Medicine | Admitting: Emergency Medicine

## 2015-05-23 DIAGNOSIS — G43809 Other migraine, not intractable, without status migrainosus: Secondary | ICD-10-CM | POA: Diagnosis not present

## 2015-05-23 DIAGNOSIS — Z8659 Personal history of other mental and behavioral disorders: Secondary | ICD-10-CM | POA: Insufficient documentation

## 2015-05-23 DIAGNOSIS — Z8719 Personal history of other diseases of the digestive system: Secondary | ICD-10-CM | POA: Diagnosis not present

## 2015-05-23 DIAGNOSIS — Z872 Personal history of diseases of the skin and subcutaneous tissue: Secondary | ICD-10-CM | POA: Insufficient documentation

## 2015-05-23 DIAGNOSIS — R112 Nausea with vomiting, unspecified: Secondary | ICD-10-CM | POA: Diagnosis present

## 2015-05-23 DIAGNOSIS — Z87440 Personal history of urinary (tract) infections: Secondary | ICD-10-CM | POA: Diagnosis not present

## 2015-05-23 DIAGNOSIS — Z8742 Personal history of other diseases of the female genital tract: Secondary | ICD-10-CM | POA: Diagnosis not present

## 2015-05-23 DIAGNOSIS — Z3202 Encounter for pregnancy test, result negative: Secondary | ICD-10-CM | POA: Diagnosis not present

## 2015-05-23 DIAGNOSIS — M545 Low back pain: Secondary | ICD-10-CM | POA: Diagnosis not present

## 2015-05-23 DIAGNOSIS — F17210 Nicotine dependence, cigarettes, uncomplicated: Secondary | ICD-10-CM | POA: Diagnosis not present

## 2015-05-23 HISTORY — DX: Migraine, unspecified, not intractable, without status migrainosus: G43.909

## 2015-05-23 LAB — URINALYSIS, ROUTINE W REFLEX MICROSCOPIC
BILIRUBIN URINE: NEGATIVE
Glucose, UA: NEGATIVE mg/dL
HGB URINE DIPSTICK: NEGATIVE
KETONES UR: NEGATIVE mg/dL
NITRITE: NEGATIVE
PH: 6 (ref 5.0–8.0)
Protein, ur: NEGATIVE mg/dL
Specific Gravity, Urine: 1.029 (ref 1.005–1.030)

## 2015-05-23 LAB — URINE MICROSCOPIC-ADD ON

## 2015-05-23 LAB — POC URINE PREG, ED: Preg Test, Ur: NEGATIVE

## 2015-05-23 MED ORDER — DEXAMETHASONE SODIUM PHOSPHATE 10 MG/ML IJ SOLN
10.0000 mg | Freq: Once | INTRAMUSCULAR | Status: AC
  Administered 2015-05-23: 10 mg via INTRAVENOUS
  Filled 2015-05-23: qty 1

## 2015-05-23 MED ORDER — PROCHLORPERAZINE EDISYLATE 5 MG/ML IJ SOLN
10.0000 mg | Freq: Once | INTRAMUSCULAR | Status: AC
  Administered 2015-05-23: 10 mg via INTRAVENOUS
  Filled 2015-05-23: qty 2

## 2015-05-23 MED ORDER — DIPHENHYDRAMINE HCL 50 MG/ML IJ SOLN
25.0000 mg | Freq: Once | INTRAMUSCULAR | Status: AC
  Administered 2015-05-23: 25 mg via INTRAVENOUS
  Filled 2015-05-23: qty 1

## 2015-05-23 MED ORDER — KETOROLAC TROMETHAMINE 30 MG/ML IJ SOLN
30.0000 mg | Freq: Once | INTRAMUSCULAR | Status: AC
  Administered 2015-05-23: 30 mg via INTRAVENOUS
  Filled 2015-05-23: qty 1

## 2015-05-23 MED ORDER — CYCLOBENZAPRINE HCL 10 MG PO TABS: 10.0000 mg | ORAL_TABLET | Freq: Two times a day (BID) | ORAL | Status: DC | PRN

## 2015-05-23 NOTE — Discharge Instructions (Signed)

## 2015-05-23 NOTE — ED Notes (Addendum)
Left work for migraine and vomiting on Monday. Pain has worsened since, began feeling dizzy/lightheaded yesterday from migraine. Photophobia present. Denies fever/neck pain. Hx of migraines, has been treated in the ED for it with a "migraine cocktail that always helps." No neuro deficits observed in triage. Pt c/o lower back pain worsening over the last couple weeks, "always feels like my bladder is full." Denies other urinary symptoms.

## 2015-05-23 NOTE — ED Provider Notes (Signed)
CSN: [id number]     Arrival date & time 05/23/15  [id number] History   First MD Initiated Contact with Patient 05/23/15 1109     Chief Complaint  Patient presents with  . Migraine  . Emesis     (Consider location/radiation/quality/duration/timing/severity/associated sxs/prior Treatment) HPI   Headache, typical of migraines, emesis began Monday then headache began slowly, temples and eyes, light sensitivity, 9/10, noise doesn't affect it, Aleve doesn't help   Also 3 wks of back pain, worse in the mornings, lower back, radiates to stomach, saw dr. Had testing for UTI and STD which was negative.  No numbness, no weakness, no loss control bowel or blader  Past Medical History  Diagnosis Date  . Hiatal hernia   . History of urinary tract infection   . Anxiety   . Depression     age 30yo  . GERD (gastroesophageal reflux disease)   . Wears contact lenses   . Eczema   . Uterine cyst   . Migraine    Past Surgical History  Procedure Laterality Date  . Tubal ligation    . Esophagogastroduodenoscopy  2014    hiatal hernia; Dr. Elnoria HowardHung   Family History  Problem Relation Age of Onset  . Hypertension Other   . Diabetes Other   . Hypertension Mother   . Cancer Mother     uterine  . Other Father     unknown  . Cancer Sister     cervical  . Hypertension Brother   . Hyperlipidemia Brother   . Diabetes Brother   . Cancer Maternal Aunt     breast  . Stroke Maternal Grandfather   . Heart disease Maternal Grandfather   . Depression Sister    Social History  Substance Use Topics  . Smoking status: Current Every Day Smoker -- 0.50 packs/day for 15 years    Types: Cigarettes  . Smokeless tobacco: None  . Alcohol Use: Yes     Comment: occasional   OB History    No data available     Review of Systems  Constitutional: Negative for fever.  HENT: Negative for sore throat.   Eyes: Negative for visual disturbance.  Respiratory: Negative for cough and shortness of breath.    Cardiovascular: Negative for chest pain.  Gastrointestinal: Positive for nausea and vomiting. Negative for abdominal pain (not now) and diarrhea.  Genitourinary: Negative for vaginal bleeding, vaginal discharge and difficulty urinating.  Musculoskeletal: Positive for back pain. Negative for neck pain.  Skin: Negative for rash.  Neurological: Positive for light-headedness and headaches. Negative for syncope.      Allergies  Review of patient's allergies indicates no known allergies.  Home Medications   Prior to Admission medications   Medication Sig Start Date End Date Taking? Authorizing Provider  diphenhydrAMINE (BENADRYL) 25 mg capsule Take 50 mg by mouth every 6 (six) hours as needed for allergies.   Yes Historical Provider, MD  naproxen sodium (ANAPROX) 220 MG tablet Take 220-440 mg by mouth 2 (two) times daily as needed (for pain).   Yes Historical Provider, MD  cyclobenzaprine (FLEXERIL) 10 MG tablet Take 1 tablet (10 mg total) by mouth 2 (two) times daily as needed for muscle spasms. 05/23/15   Alvira MondayErin Bessie Livingood, MD  triamcinolone cream (KENALOG) 0.1 % Apply 1 application topically 2 (two) times daily. Patient not taking: Reported on 05/23/2015 04/18/15   Kermit Baloavid S Tysinger, PA-C   BP 109/69 mmHg  Pulse 69  Temp(Src) 98.2 F (36.8 C) (Oral)  Resp  16  SpO2 100% Physical Exam  Constitutional: She is oriented to person, place, and time. She appears well-developed and well-nourished. No distress.  HENT:  Head: Normocephalic and atraumatic.  Eyes: Conjunctivae and EOM are normal.  Neck: Normal range of motion.  Cardiovascular: Normal rate, regular rhythm, normal heart sounds and intact distal pulses.  Exam reveals no gallop and no friction rub.   No murmur heard. Pulmonary/Chest: Effort normal and breath sounds normal. No respiratory distress. She has no wheezes. She has no rales.  Abdominal: Soft. She exhibits no distension. There is no tenderness. There is no guarding.   Musculoskeletal: She exhibits no edema or tenderness.       Lumbar back: She exhibits bony tenderness.  Neurological: She is alert and oriented to person, place, and time.  Skin: Skin is warm and dry. No rash noted. She is not diaphoretic. No erythema.  Nursing note and vitals reviewed.   ED Course  Procedures (including critical care time) Labs Review Labs Reviewed  URINALYSIS, ROUTINE W REFLEX MICROSCOPIC (NOT AT [facility]) - Abnormal; Notable for the following:    Color, Urine AMBER (*)    APPearance CLOUDY (*)    Leukocytes, UA SMALL (*)    All other components within normal limits  URINE MICROSCOPIC-ADD ON - Abnormal; Notable for the following:    Squamous Epithelial / LPF 0-5 (*)    Bacteria, UA RARE (*)    All other components within normal limits  POC URINE PREG, ED    Imaging Review No results found. I have personally reviewed and evaluated these images and lab results as part of my medical decision-making.   EKG Interpretation None      MDM   Final diagnoses:  Other migraine without status migrainosus, not intractable  Low back pain, unspecified back pain laterality, with sciatica presence unspecified  50 female with history of migraines, GERD, anxiety, depression, presents with concern of headache.  Headache began slowly, no trauma, no fevers, and normal neurologic exam and have low suspicion for [facility], SDH or meningitis.  Patient was given compazine, decadron and benadryl with improvement in headache.  Patient also reports back pain over the past 3 weeks.  Patient has a normal neurologic exam and denies any urinary retention or overflow incontinence, stool incontinence, saddle anesthesia, fever, IV drug use, trauma, chronic steroid use or immunocompromise and have low suspicion suspicion for cauda equina, fracture, epidural abscess, or vertebral osteomyelitis.  Gave rx for flexeril for back pain. Recommend PCP follow up and outpatient imaging if pain continues. Patient  discharged in stable condition with understanding of reasons to return.     Alvira Monday, MD 05/23/15 2213

## 2015-07-11 ENCOUNTER — Encounter (HOSPITAL_COMMUNITY): Payer: Self-pay | Admitting: Emergency Medicine

## 2015-07-11 ENCOUNTER — Emergency Department (HOSPITAL_COMMUNITY)
Admission: EM | Admit: 2015-07-11 | Discharge: 2015-07-12 | Disposition: A | Discharge status: Home / Self Care | Payer: 59 | Attending: Emergency Medicine | Admitting: Emergency Medicine

## 2015-07-11 DIAGNOSIS — L0291 Cutaneous abscess, unspecified: Secondary | ICD-10-CM

## 2015-07-11 DIAGNOSIS — F329 Major depressive disorder, single episode, unspecified: Secondary | ICD-10-CM | POA: Insufficient documentation

## 2015-07-11 DIAGNOSIS — Z79899 Other long term (current) drug therapy: Secondary | ICD-10-CM | POA: Diagnosis not present

## 2015-07-11 DIAGNOSIS — L0231 Cutaneous abscess of buttock: Secondary | ICD-10-CM | POA: Diagnosis present

## 2015-07-11 DIAGNOSIS — F17210 Nicotine dependence, cigarettes, uncomplicated: Secondary | ICD-10-CM | POA: Insufficient documentation

## 2015-07-11 MED ORDER — BACITRACIN ZINC 500 UNIT/GM EX OINT
TOPICAL_OINTMENT | Freq: Two times a day (BID) | CUTANEOUS | Status: DC
  Administered 2015-07-12: 01:00:00 via TOPICAL
  Filled 2015-07-11: qty 0.9

## 2015-07-11 MED ORDER — HYDROCODONE-ACETAMINOPHEN 5-325 MG PO TABS
1.0000 | ORAL_TABLET | Freq: Once | ORAL | Status: AC
  Administered 2015-07-11: 1 via ORAL
  Filled 2015-07-11: qty 1

## 2015-07-11 MED ORDER — SULFAMETHOXAZOLE-TRIMETHOPRIM 800-160 MG PO TABS: 1.0000 | ORAL_TABLET | Freq: Two times a day (BID) | ORAL | Status: AC

## 2015-07-11 MED ORDER — LIDOCAINE HCL 2 % IJ SOLN
20.0000 mL | Freq: Once | INTRAMUSCULAR | Status: AC
  Administered 2015-07-11: 400 mg
  Filled 2015-07-11: qty 20

## 2015-07-11 NOTE — ED Provider Notes (Signed)
CSN: [id number]     Arrival date & time 07/11/15  1907 History   First MD Initiated Contact with Patient 07/11/15 2212     Chief Complaint  Patient presents with  . Abscess   (Consider location/radiation/quality/duration/timing/severity/associated sxs/prior Treatment) Patient is a 30 y.o. female presenting with abscess. The history is provided by the patient and medical records. No language interpreter was used.  Abscess Associated symptoms: no fever, no headaches, no nausea and no vomiting      Patricia Hayes is a 30 y.o. female  who presents to the Emergency Department complaining of worsening pain of left buttock. Patient states she had a mole of this area that "fell off" two weeks ago. She states that she was not scratching at the area and that mole just fell off leaving a small hole. She had mild pain and tenderness to the touch. She was cleaning area with peroxide and putting OTC topical ABX ointment on the area as well. Over the last 3 days, pain has intensified and she noticed yellow drainage from the site in her underwear. Pain worse when sitting and having BM's. Denies fever/chills, abdominal pain.    Past Medical History  Diagnosis Date  . Hiatal hernia   . History of urinary tract infection   . Anxiety   . Depression     age 30yo  . GERD (gastroesophageal reflux disease)   . Wears contact lenses   . Eczema   . Uterine cyst   . Migraine    Past Surgical History  Procedure Laterality Date  . Tubal ligation    . Esophagogastroduodenoscopy  2014    hiatal hernia; Dr. Elnoria HowardHung   Family History  Problem Relation Age of Onset  . Hypertension Other   . Diabetes Other   . Hypertension Mother   . Cancer Mother     uterine  . Other Father     unknown  . Cancer Sister     cervical  . Hypertension Brother   . Hyperlipidemia Brother   . Diabetes Brother   . Cancer Maternal Aunt     breast  . Stroke Maternal Grandfather   . Heart disease Maternal Grandfather   .  Depression Sister    Social History  Substance Use Topics  . Smoking status: Current Every Day Smoker -- 0.50 packs/day for 15 years    Types: Cigarettes  . Smokeless tobacco: None  . Alcohol Use: Yes     Comment: occasional   OB History    No data available     Review of Systems  Constitutional: Negative for fever and chills.  HENT: Negative for congestion.   Respiratory: Negative for cough and shortness of breath.   Cardiovascular: Negative.   Gastrointestinal: Negative for nausea, vomiting and abdominal pain.  Genitourinary: Negative for dysuria.  Musculoskeletal: Negative for back pain and neck pain.  Skin: Positive for wound.  Allergic/Immunologic: Negative for immunocompromised state.  Neurological: Negative for headaches.      Allergies  Review of patient's allergies indicates no known allergies.  Home Medications   Prior to Admission medications   Medication Sig Start Date End Date Taking? Authorizing Provider  aspirin-acetaminophen-caffeine (EXCEDRIN MIGRAINE) [phone] MG tablet Take 1 tablet by mouth every 6 (six) hours as needed for headache.   Yes Historical Provider, MD  diphenhydrAMINE (BENADRYL) 25 mg capsule Take 50 mg by mouth every 6 (six) hours as needed for allergies.   Yes Historical Provider, MD  naproxen sodium (ANAPROX) 220 MG tablet  Take 220-440 mg by mouth 2 (two) times daily as needed (for pain).   Yes Historical Provider, MD  cyclobenzaprine (FLEXERIL) 10 MG tablet Take 1 tablet (10 mg total) by mouth 2 (two) times daily as needed for muscle spasms. Patient not taking: Reported on 07/11/2015 05/23/15   Alvira MondayErin Schlossman, MD  sulfamethoxazole-trimethoprim (BACTRIM DS,SEPTRA DS) 800-160 MG tablet Take 1 tablet by mouth 2 (two) times daily. 07/11/15 07/18/15  Chase PicketJaime Pilcher Ward, PA-C  triamcinolone cream (KENALOG) 0.1 % Apply 1 application topically 2 (two) times daily. Patient not taking: Reported on 05/23/2015 04/18/15   Kermit Baloavid S Tysinger, PA-C   BP  105/71 mmHg  Pulse 66  Temp(Src) 98.2 F (36.8 C) (Oral)  Resp 18  SpO2 100%  LMP 06/30/2015 Physical Exam  Constitutional: She is oriented to person, place, and time. She appears well-developed and well-nourished. No distress.  HENT:  Head: Normocephalic and atraumatic.  Cardiovascular: Normal rate, regular rhythm and normal heart sounds.   Pulmonary/Chest: Effort normal and breath sounds normal. No respiratory distress.  Abdominal: Soft. She exhibits no distension. There is no tenderness.  Musculoskeletal: She exhibits no edema.  Neurological: She is alert and oriented to person, place, and time.  Skin: Skin is warm and dry.  Left buttock with 2x2 area of induration- very small central opening with purulent drainage. No surrounding erythema.   Nursing note and vitals reviewed.   ED Course  Procedures (including critical care time)  INCISION AND DRAINAGE Performed by: Chase PicketJaime Pilcher Ward Consent: Verbal consent obtained. Risks and benefits: risks, benefits and alternatives were discussed Type: abscess Body area: left buttock Anesthesia: local infiltration Incision was made with a scalpel. Local anesthetic: lidocaine 2% Anesthetic total: 5 ml Complexity: complex Blunt dissection to break up loculations Drainage: purulent Drainage amount: moderate Packing material: 1/2 in iodoform gauze Patient tolerance: Patient tolerated the procedure well with no immediate complications.   Labs Review Labs Reviewed - No data to display  Imaging Review No results found. I have personally reviewed and evaluated these images and lab results as part of my medical decision-making.   EKG Interpretation None      MDM   Final diagnoses:  Abscess   Patricia Hayes presents to ED for abscess to left buttock requiring incision and drainage. No evidence of surrounding erythema to suggest cellulitis. No crepitance to suggest necrotizing fasciitis. Incision and drainage performed per  procedure note. Patient tolerated the procedure well. Patient was prescribed Bactrim. Wound care instructions discussed. Wound check in 2-3 days. Return to ER if concern for spread of infection, increasing pain, fevers, or other concerns. All questions answered.   [facility]rum Health Butterworth CampusJaime Pilcher Ward, PA-C 07/12/15 0033  Lavera Guiseana Duo Liu, MD 07/12/15 [phone]

## 2015-07-11 NOTE — Discharge Instructions (Signed)
Follow up with your doctor, an urgent care, or return to ED for wound check and to remove your packing in 48-72 hours. Please take all of your antibiotics until finished! Ibuprofen or tylenol as needed for pain. Return to the emergency department if you have  a fever that persists greater than 101 or your abscess appears to become infected (growing surrounding redness and warmth).   Abscess An abscess (boil or furuncle) is an infected area that contains a collection of pus.   SYMPTOMS Signs and symptoms of an abscess include pain, tenderness, redness, or hardness. You may feel a moveable soft area under your skin. An abscess can occur anywhere in the body.   TREATMENT  A surgical cut (incision) may be made over your abscess to drain the pus. Gauze may be packed into the space or a drain may be looped through the abscess cavity (pocket). This provides a drain that will allow the cavity to heal from the inside outwards. The abscess may be painful for a few days, but should feel much better if it was drained.  Your abscess, if seen early, may not have localized and may not have been drained. If not, another appointment may be required if it does not get better on its own or with medications.  HOME CARE INSTRUCTIONS   Only take over-the-counter or prescription medicines for pain, discomfort, or fever as directed by your caregiver.   Take your antibiotics as directed if they were prescribed. Finish them even if you start to feel better.   Keep the skin and clothes clean around your abscess.   If the abscess was drained, you will need to use gauze dressing to collect any draining pus. Dressings will typically need to be changed 3 or more times a day.   The infection may spread by skin contact with others. Avoid skin contact as much as possible.   Practice good hygiene. This includes regular hand washing, cover any draining skin lesions, and do not share personal care items.   If you participate  in sports, do not share athletic equipment, towels, whirlpools, or personal care items. Shower after every practice or tournament.   If a draining area cannot be adequately covered:   Do not participate in sports.   Children should not participate in day care until the wound has healed or drainage stops.   If your caregiver has given you a follow-up appointment, it is very important to keep that appointment. Not keeping the appointment could result in a much worse infection, chronic or permanent injury, pain, and disability. If there is any problem keeping the appointment, you must call back to this facility for assistance.   SEEK MEDICAL CARE IF:   You develop increased pain, swelling, redness, drainage, or bleeding in the wound site.   You develop signs of generalized infection including muscle aches, chills, fever, or a general ill feeling.   You have an oral temperature above 102 F (38.9 C).  MAKE SURE YOU:   Understand these instructions.   Will watch your condition.   Will get help right away if you are not doing well or get worse.  Document Released: 10/09/2004 Document Revised: 09/11/2010 Document Reviewed: 08/03/2007 [employer] Patient Information 2012 [place] [place].

## 2015-07-11 NOTE — ED Notes (Signed)
Pt states that she had a mole fall off on near her rectum and now has developed an abscess in its place. Alert and oriented.

## 2015-07-20 ENCOUNTER — Ambulatory Visit (INDEPENDENT_AMBULATORY_CARE_PROVIDER_SITE_OTHER): Payer: 59 | Admitting: Medical

## 2015-07-20 ENCOUNTER — Encounter: Payer: Self-pay | Admitting: Medical

## 2015-07-20 VITALS — BP 108/70 | HR 84 | Wt 172.0 lb

## 2015-07-20 DIAGNOSIS — L0231 Cutaneous abscess of buttock: Secondary | ICD-10-CM

## 2015-07-20 DIAGNOSIS — L298 Other pruritus: Secondary | ICD-10-CM

## 2015-07-20 DIAGNOSIS — R519 Headache, unspecified: Secondary | ICD-10-CM

## 2015-07-20 DIAGNOSIS — R51 Headache: Secondary | ICD-10-CM

## 2015-07-20 DIAGNOSIS — R110 Nausea: Secondary | ICD-10-CM

## 2015-07-20 DIAGNOSIS — N898 Other specified noninflammatory disorders of vagina: Secondary | ICD-10-CM

## 2015-07-20 MED ORDER — TOPIRAMATE 25 MG PO TABS: 25.0000 mg | ORAL_TABLET | Freq: Every day | ORAL | Status: DC

## 2015-07-20 MED ORDER — SUMATRIPTAN SUCCINATE 25 MG PO TABS: ORAL_TABLET | ORAL | Status: DC

## 2015-07-20 MED ORDER — FLUCONAZOLE 150 MG PO TABS: 150.0000 mg | ORAL_TABLET | Freq: Once | ORAL | Status: DC

## 2015-07-20 NOTE — Progress Notes (Signed)
Subjective: Chief Complaint  Patient presents with  . abcess    drained at hospital antibiotics given and getting yeast infection. has stayed nauseaous for the last three weeks. can only eat if takes benedryl. getting headaches on a regular basis, and stated that she is still getting migraines too.    Here for hospital f/u and other concerns.  Was seeing last week in the ED for abscess of left buttock, had I&D, was put on Bactrim.  Feels much improved.  Has 3 days left of bactrim since she started it 2 days late.   She notes she typically gets yeast infection when on antibiotic, and is feeling some vaginal itching.    Wants medication for this.  She notes ongoing problems with nausea and headaches.   She has seen me back 3 years ago for chronic headaches that seemed to be more stress related then.   She notes intermittent headache issue over time, but in the last 2 months getting daily headaches, typically bitemporal, typically lasting minutes to hours.  Does get bad migraines from time to time, but these aren't the typical migraines she has had with nausea, photophobia, phonophobia and nausea.   Uses Excedrin often of late.   Denies drinking caffeine regularly, sleeps ok, no recent sinus or allergy problems, no major stressors currently.  She does skip breakfast.  No numbness, tingling, weakness, dental pain, hearing loss, vision loss.   She just recently saw eye doctor, has contacts, no major worries per eye doctor from eye standpoint in regards to headaches. No other aggravating or relieving factors. No other complaint.   Past Medical History  Diagnosis Date  . Hiatal hernia   . History of urinary tract infection   . Anxiety   . Depression     age 30yo  . GERD (gastroesophageal reflux disease)   . Wears contact lenses   . Eczema   . Uterine cyst   . Migraine    ROS as in subjective    Objective: BP 108/70 mmHg  Pulse 84  Wt 172 lb (78.019 kg)  LMP 06/27/2015  General  appearance: alert, no distress, WD/WN HEENT: normocephalic, sclerae anicteric, PERRLA, EOMi, nares patent, no discharge or erythema, pharynx normal Oral cavity: MMM, no lesions Neck: supple, no lymphadenopathy, no thyromegaly, no masses Pulses: 2+ symmetric, upper and lower extremities, normal cap refill Neurological: alert, oriented x 3, CN2-12 intact, strength normal upper extremities and lower extremities, sensation normal throughout, DTRs 2+ throughout, no cerebellar signs, gait normal Psychiatric: normal affect, behavior normal, pleasant  Left buttock approx 3cm from anus inferiorly with small 4mm surgical wound, mostly closed, no erythema, induration, fluctuance, or warmth     Assessment: Encounter Diagnoses  Name Primary?  . Chronic intractable headache, unspecified headache type Yes  . Chronic nausea   . Left buttock abscess   . Vaginal itching      Plan: Chronic headache, chronic nausea - begin headache diary.  Begin trial of Topamax 25mg  QHS, c/t imitrex prn.   discussed types of headaches, possible triggers. Advised she eat breakfast daily, limit caffeine, discussed sleep hygiene.  F/u 64mo. Abscess - 95% resolved.  Finish out the bactrim, c/t good hygiene. Vaginal itching related to recent antibiotic use - begin diflucan  Patricia Hayes was seen today for abcess.  Diagnoses and all orders for this visit:  Chronic intractable headache, unspecified headache type  Chronic nausea  Left buttock abscess  Vaginal itching  Other orders -     topiramate (TOPAMAX)  25 MG tablet; Take 1 tablet (25 mg total) by mouth daily. -     SUMAtriptan (IMITREX) 25 MG tablet; May repeat in 2 hours if headache persists or recurs..  Max 2 daily -     fluconazole (DIFLUCAN) 150 MG tablet; Take 1 tablet (150 mg total) by mouth once.

## 2015-08-21 ENCOUNTER — Ambulatory Visit: Payer: 59 | Admitting: Medical

## 2015-08-24 ENCOUNTER — Encounter: Payer: Self-pay | Admitting: Medical

## 2015-10-21 ENCOUNTER — Emergency Department (HOSPITAL_COMMUNITY)
Admission: EM | Admit: 2015-10-21 | Discharge: 2015-10-22 | Disposition: A | Discharge status: Home / Self Care | Payer: 59 | Attending: Emergency Medicine | Admitting: Emergency Medicine

## 2015-10-21 ENCOUNTER — Encounter (HOSPITAL_COMMUNITY): Payer: Self-pay | Admitting: Emergency Medicine

## 2015-10-21 DIAGNOSIS — F17210 Nicotine dependence, cigarettes, uncomplicated: Secondary | ICD-10-CM | POA: Insufficient documentation

## 2015-10-21 DIAGNOSIS — L2082 Flexural eczema: Secondary | ICD-10-CM | POA: Insufficient documentation

## 2015-10-21 DIAGNOSIS — Z7982 Long term (current) use of aspirin: Secondary | ICD-10-CM | POA: Diagnosis not present

## 2015-10-21 DIAGNOSIS — Z791 Long term (current) use of non-steroidal anti-inflammatories (NSAID): Secondary | ICD-10-CM | POA: Diagnosis not present

## 2015-10-21 DIAGNOSIS — Z79899 Other long term (current) drug therapy: Secondary | ICD-10-CM | POA: Insufficient documentation

## 2015-10-21 DIAGNOSIS — L0100 Impetigo, unspecified: Secondary | ICD-10-CM

## 2015-10-21 DIAGNOSIS — R21 Rash and other nonspecific skin eruption: Secondary | ICD-10-CM | POA: Diagnosis present

## 2015-10-21 NOTE — ED Triage Notes (Signed)
Pt reports rash to anticubital area on bilateral arms. Pt states that rash initially started on right arm and is now on left. Pt denies any new products that may have triggered rash.

## 2015-10-22 MED ORDER — MUPIROCIN CALCIUM 2 % EX CREA: 1.0000 "application " | TOPICAL_CREAM | Freq: Two times a day (BID) | CUTANEOUS | 0 refills | Status: DC

## 2015-10-22 MED ORDER — METHYLPREDNISOLONE 4 MG PO TBPK: ORAL_TABLET | ORAL | 0 refills | Status: DC

## 2015-10-22 NOTE — ED Provider Notes (Signed)
[facility]-EMERGENCY DEPT Provider Note   CSN: [id number] Arrival date & time: 10/21/15  2136     History   Chief Complaint Chief Complaint  Patient presents with  . Rash    HPI Patricia Hayes is a 30 y.o. female.  HPI Patient has history of eczema. He reports she's developed a recent flare of scaling and thickening rash on her hands and also on the insides of her elbows. She reports that the rash on the inside of her elbow has gotten wet and more irritated. She reports that is not typical. Past Medical History:  Diagnosis Date  . Anxiety   . Depression    age 38yo  . Eczema   . GERD (gastroesophageal reflux disease)   . Hiatal hernia   . History of urinary tract infection   . Migraine   . Uterine cyst   . Wears contact lenses     Patient Active Problem List   Diagnosis Date Noted  . Eczema 01/17/2014  . Irregular periods 01/17/2014  . Depression 01/17/2014  . Chronic nausea 01/17/2014  . Other migraine without status migrainosus, not intractable 01/17/2014    Past Surgical History:  Procedure Laterality Date  . ESOPHAGOGASTRODUODENOSCOPY  2014   hiatal hernia; Dr. Elnoria Howard  . TUBAL LIGATION      OB History    No data available       Home Medications    Prior to Admission medications   Medication Sig Start Date End Date Taking? Authorizing Provider  aspirin-acetaminophen-caffeine (EXCEDRIN MIGRAINE) [phone] MG tablet Take 1 tablet by mouth every 6 (six) hours as needed for headache. Reported on 07/20/2015    Historical Provider, MD  diphenhydrAMINE (BENADRYL) 25 mg capsule Take 50 mg by mouth every 6 (six) hours as needed for allergies.    Historical Provider, MD  fluconazole (DIFLUCAN) 150 MG tablet Take 1 tablet (150 mg total) by mouth once. 07/20/15   Kermit Balo Tysinger, PA-C  methylPREDNISolone (MEDROL DOSEPAK) 4 MG TBPK tablet Dose pack 10/22/15   Arby Barrette, MD  mupirocin cream (BACTROBAN) 2 % Apply 1 application topically 2 (two) times daily. 10/22/15    Arby Barrette, MD  naproxen sodium (ANAPROX) 220 MG tablet Take 220-440 mg by mouth 2 (two) times daily as needed (for pain). Reported on 07/20/2015    Historical Provider, MD  SUMAtriptan (IMITREX) 25 MG tablet May repeat in 2 hours if headache persists or recurs..  Max 2 daily 07/20/15   Kermit Balo Tysinger, PA-C  topiramate (TOPAMAX) 25 MG tablet Take 1 tablet (25 mg total) by mouth daily. 07/20/15   Jac Canavan, PA-C    Family History Family History  Problem Relation Age of Onset  . Hypertension Mother   . Cancer Mother     uterine  . Other Father     unknown  . Cancer Sister     cervical  . Hypertension Brother   . Hyperlipidemia Brother   . Diabetes Brother   . Cancer Maternal Aunt     breast  . Hypertension Other   . Diabetes Other   . Stroke Maternal Grandfather   . Heart disease Maternal Grandfather   . Depression Sister     Social History Social History  Substance Use Topics  . Smoking status: Current Every Day Smoker    Packs/day: 0.50    Years: 15.00    Types: Cigarettes  . Smokeless tobacco: Never Used  . Alcohol use Yes     Comment: occasional  Allergies   Review of patient's allergies indicates no known allergies.   Review of Systems Review of Systems Constitutional: No fevers no chills GI: No nausea no vomiting no abdominal pain  Physical Exam Updated Vital Signs BP 112/77 (BP Location: Left Arm)   Pulse 97   Temp 98.7 F (37.1 C) (Oral)   Resp 18   Ht 5\' 4"  (1.626 m)   Wt 160 lb (72.6 kg)   LMP 10/21/2015 (Exact Date)   SpO2 100%   BMI 27.46 kg/m   Physical Exam  Constitutional: She is oriented to person, place, and time. She appears well-developed and well-nourished. No distress.  HENT:  Head: Normocephalic and atraumatic.  Eyes: Conjunctivae and EOM are normal.  Pulmonary/Chest: Effort normal.  Musculoskeletal: Normal range of motion.  Neurological: She is alert and oriented to person, place, and time. She exhibits normal  muscle tone. Coordination normal.  Skin: Skin is warm and dry. Rash noted.  Patient has thick, dry papular rash in the webspaces of both hands. There is also some thickening and patchy scaliness on her palms. The patient has moist, flat erythematous rash in the right antecubital fossa that is approximately 6 x 4 cm oval.. This is in the upper portion of the AC fossa. There is some honey crusting discharge. No diffuse, blanching erythema. Normal range of motion of the elbow. The left before meals fossa has 2 more discrete patches of flat approximately 2 cm oval plaques with a dry, pale pink eschar.  Patient has thick scaling rash on her heels but her toes are spared.     ED Treatments / Results  Labs (all labs ordered are listed, but only abnormal results are displayed) Labs Reviewed - No data to display  EKG  EKG Interpretation None       Radiology No results found.  Procedures Procedures (including critical care time)  Medications Ordered in ED Medications - No data to display   Initial Impression / Assessment and Plan / ED Course  I have reviewed the triage vital signs and the nursing notes.  Pertinent labs & imaging results that were available during my care of the patient were reviewed by me and considered in my medical decision making (see chart for details).  Clinical Course    Final Clinical Impressions(s) / ED Diagnoses   Final diagnoses:  Flexural eczema  Impetigo   Patient has a long history of eczema. She reports that the thick scaling rash in her webspaces is consistent with occasional flares that she gets. She reports this is atypical. However for her to have this moist lesion. At this time, I suspect secondary infection from primary eczema. There is no significant surrounding cellulitis and also no evidence of abscess formation. Patient will be prescribed Bactroban to apply topically to the area that appears to have secondary infection that looks like  impetigo and will have a Medrol dose pack for the flare on her hands. Patient is advised to try to establish an appointment with a dermatologist within the next couple of weeks. New Prescriptions New Prescriptions   METHYLPREDNISOLONE (MEDROL DOSEPAK) 4 MG TBPK TABLET    Dose pack   MUPIROCIN CREAM (BACTROBAN) 2 %    Apply 1 application topically 2 (two) times daily.     Arby BarretteMarcy Gabryela Kimbrell, MD 10/22/15 [phone]

## 2015-12-14 ENCOUNTER — Ambulatory Visit (INDEPENDENT_AMBULATORY_CARE_PROVIDER_SITE_OTHER): Payer: 59 | Admitting: Family Medicine

## 2015-12-14 ENCOUNTER — Encounter: Payer: Self-pay | Admitting: Family Medicine

## 2015-12-14 VITALS — BP 112/60 | HR 86 | Temp 98.2°F | Resp 16 | Wt 177.2 lb

## 2015-12-14 DIAGNOSIS — L2082 Flexural eczema: Secondary | ICD-10-CM | POA: Diagnosis not present

## 2015-12-14 MED ORDER — CRISABOROLE 2 % EX OINT: 1.0000 "application " | TOPICAL_OINTMENT | Freq: Two times a day (BID) | CUTANEOUS | 0 refills | Status: DC

## 2015-12-14 NOTE — Patient Instructions (Signed)
Atopic Dermatitis Atopic dermatitis is a skin disorder that causes inflammation of the skin. This is the most common type of eczema. Eczema is a group of skin conditions that cause the skin to be itchy, red, and swollen. This condition is generally worse during the cooler winter months and often improves during the warm summer months. Symptoms can vary from person to person. Atopic dermatitis usually starts showing signs in infancy and can last through adulthood. This condition cannot be passed from one person to another (non-contagious), but is more common in families. Atopic dermatitis may not always be present. When it is present, it is called a flare-up. What are the causes? The exact cause of this condition is not known. Flare-ups of the condition may be triggered by:  Contact with something you are sensitive or allergic to.  Stress.  Certain foods.  Extremely hot or cold weather.  Harsh chemicals and soaps.  Dry air.  Chlorine. What increases the risk? This condition is more likely to develop in people who have a personal history or family history of eczema, allergies, asthma, or hay fever. What are the signs or symptoms? Symptoms of this condition include:  Dry, scaly skin.  Red, itchy rash.  Itchiness, which can be severe. This may occur before the skin rash. This can make sleeping difficult.  Skin thickening and cracking can occur over time. How is this diagnosed? This condition is diagnosed based on your symptoms, a medical history, and a physical exam. How is this treated? There is no cure for this condition, but symptoms can usually be controlled. Treatment focuses on:  Controlling the itching and scratching. You may be given medicines, such as antihistamines or steroid creams.  Limiting exposure to things that you are sensitive or allergic to (allergens).  Recognizing situations that cause stress and developing a plan to manage stress. If your atopic dermatitis  does not get better with medicines or is all over your body (widespread) , a treatment using a specific type of light (phototherapy) may be used. Follow these instructions at home: Skin care  Keep your skin well-moisturized. This seals in moisture and help prevent dryness.  Use unscented lotions that have petroleum in them.  Avoid lotions that contain alcohol and water. They can dry the skin.  Keep baths or showers short (less than 5 minutes) in warm water. Do not use hot water.  Use mild, unscented cleansers for bathing. Avoid soap and bubble bath.  Apply a moisturizer to your skin right after a bath or shower.   Do not apply anything to your skin without checking with your health care provider. General instructions  Dress in clothes made of cotton or cotton blends. Dress lightly because heat increases itching.  When washing your clothes, rinse your clothes twice so all of the soap is removed.  Avoid any triggers that can cause a flare-up.  Try to manage your stress.  Keep your fingernails cut short.  Avoid scratching. Scratching makes the rash and itching worse. It may also result in a skin infection (impetigo) due to a break in the skin caused by scratching.  Take or apply over-the-counter and prescription medicines only as told by your health care provider.  Keep all follow-up visits as told by your health care provider. This is important.  Do not be around people who have cold sores or fever blisters. If you get the infection, it may cause your atopic dermatitis to worsen. Contact a health care provider if:  Your itching   interferes with sleep.  Your rash gets worse or is not better within one week of starting treatment.  You have a fever.  You have a rash flare-up after having contact with someone who has cold sores or fever blisters. Get help right away if:  You develop pus or soft yellow scabs in the rash area. Summary  This condition causes a red rash and  itchy, dry, scaly skin.  Treatment focuses on controlling the itching and scratching, limiting exposure to things that you are sensitive or allergic to (allergens), and recognizing situations that cause stress and developing a plan to manage stress.  Keep your skin well-moisturized.  Keep baths or showers less than 5 minutes. This information is not intended to replace advice given to you by your health care provider. Make sure you discuss any questions you have with your health care provider. Document Released: 12/28/1999 Document Revised: 06/07/2015 Document Reviewed: 08/02/2012 Elsevier Interactive Patient Education  2017 [employer]  

## 2015-12-14 NOTE — Progress Notes (Signed)
   Subjective:    Patient ID: Patricia CroakShatiera Hayes, female    DOB: 02/05/1985, 30 y.o.   MRN: [id number]  HPI Chief Complaint  Patient presents with  . Rash    pt has rash on forearms. hx of ezcema.    She is a 30 year old female with a history of eczema who is here with complaints of a intermittent flares for the past 2 months or so. Complains of itching and antecubital and forearm rash today but states she is not currently having a bad flare.  Denies fever, chills, abdominal pain, N/V.   She was seen in the emergency department on 10/21/2015 and treated with a Medrol Dosepak and Bactroban but she states she could not afford to pick up the Bactroban.  States the rash completely cleared up with the Medrol.  She was referred to dermatologist [facility] [place] [facility] for one visit. States she was not having a flare then so they told her to come back. She did not follow up for her appointment last week.   States 4-5 days ago she noticed bumps to her bilateral forearms and this seems different than her usual eczema rash. States the dermatologist told her it was all part of her eczema.   Past Medical History:  Diagnosis Date  . Anxiety   . Depression    age 30yo  . Eczema   . GERD (gastroesophageal reflux disease)   . Hiatal hernia   . History of urinary tract infection   . Migraine   . Uterine cyst   . Wears contact lenses       Review of Systems Pertinent positives and negatives in the history of present illness.     Objective:   Physical Exam BP 112/60   Pulse 86   Temp 98.2 F (36.8 C) (Oral)   Resp 16   Wt 177 lb 3.2 oz (80.4 kg)   LMP 11/14/2015   SpO2 98%   BMI 30.42 kg/m   Dry and thickened patches of skin to bilateral antecubital fossa. No erythema or moist areas.  Normal ROM of bilateral elbows.       Assessment & Plan:  Flexural eczema - Plan: Crisaborole (EUCRISA) 2 % OINT  Discussed that she does not appear to  have a bacterial infection and I recommend trying Eucrisa and see if this helps. She plans to follow up with her dermatologist as well and I think this is a good idea.  Discussed management of eczema and flare prevention.  She will call and let us know if medication is not helping.

## 2016-11-25 ENCOUNTER — Emergency Department (HOSPITAL_COMMUNITY)
Admission: EM | Admit: 2016-11-25 | Discharge: 2016-11-25 | Disposition: A | Discharge status: Home / Self Care | Payer: 59 | Attending: Emergency Medicine | Admitting: Emergency Medicine

## 2016-11-25 ENCOUNTER — Emergency Department (HOSPITAL_COMMUNITY): Payer: 59

## 2016-11-25 ENCOUNTER — Encounter (HOSPITAL_COMMUNITY): Payer: Self-pay | Admitting: Emergency Medicine

## 2016-11-25 DIAGNOSIS — J039 Acute tonsillitis, unspecified: Secondary | ICD-10-CM | POA: Diagnosis not present

## 2016-11-25 DIAGNOSIS — F17210 Nicotine dependence, cigarettes, uncomplicated: Secondary | ICD-10-CM | POA: Diagnosis not present

## 2016-11-25 DIAGNOSIS — R05 Cough: Secondary | ICD-10-CM | POA: Diagnosis not present

## 2016-11-25 DIAGNOSIS — R6889 Other general symptoms and signs: Secondary | ICD-10-CM

## 2016-11-25 DIAGNOSIS — M791 Myalgia, unspecified site: Secondary | ICD-10-CM | POA: Diagnosis present

## 2016-11-25 DIAGNOSIS — Z79899 Other long term (current) drug therapy: Secondary | ICD-10-CM | POA: Insufficient documentation

## 2016-11-25 DIAGNOSIS — R0602 Shortness of breath: Secondary | ICD-10-CM | POA: Diagnosis not present

## 2016-11-25 DIAGNOSIS — R509 Fever, unspecified: Secondary | ICD-10-CM | POA: Diagnosis not present

## 2016-11-25 LAB — URINALYSIS, ROUTINE W REFLEX MICROSCOPIC
BILIRUBIN URINE: NEGATIVE
Glucose, UA: NEGATIVE mg/dL
Hgb urine dipstick: NEGATIVE
KETONES UR: NEGATIVE mg/dL
Leukocytes, UA: NEGATIVE
Nitrite: NEGATIVE
PH: 6 (ref 5.0–8.0)
Protein, ur: NEGATIVE mg/dL
SPECIFIC GRAVITY, URINE: 1.024 (ref 1.005–1.030)

## 2016-11-25 LAB — RAPID STREP SCREEN (MED CTR MEBANE ONLY): STREPTOCOCCUS, GROUP A SCREEN (DIRECT): NEGATIVE

## 2016-11-25 LAB — INFLUENZA PANEL BY PCR (TYPE A & B)
INFLAPCR: NEGATIVE
Influenza B By PCR: NEGATIVE

## 2016-11-25 LAB — CBC WITH DIFFERENTIAL/PLATELET
Basophils Absolute: 0 10*3/uL (ref 0.0–0.1)
Basophils Relative: 0 %
EOS ABS: 0 10*3/uL (ref 0.0–0.7)
EOS PCT: 0 %
HCT: 37.7 % (ref 36.0–46.0)
Hemoglobin: 12.7 g/dL (ref 12.0–15.0)
LYMPHS ABS: 1.6 10*3/uL (ref 0.7–4.0)
LYMPHS PCT: 8 %
MCH: 28.6 pg (ref 26.0–34.0)
MCHC: 33.7 g/dL (ref 30.0–36.0)
MCV: 84.9 fL (ref 78.0–100.0)
MONO ABS: 1.6 10*3/uL — AB (ref 0.1–1.0)
Monocytes Relative: 8 %
Neutro Abs: 16.4 10*3/uL — ABNORMAL HIGH (ref 1.7–7.7)
Neutrophils Relative %: 84 %
PLATELETS: 241 10*3/uL (ref 150–400)
RBC: 4.44 MIL/uL (ref 3.87–5.11)
RDW: 13.1 % (ref 11.5–15.5)
WBC: 19.6 10*3/uL — ABNORMAL HIGH (ref 4.0–10.5)

## 2016-11-25 LAB — BASIC METABOLIC PANEL
Anion gap: 8 (ref 5–15)
BUN: 13 mg/dL (ref 6–20)
CO2: 24 mmol/L (ref 22–32)
CREATININE: 0.88 mg/dL (ref 0.44–1.00)
Calcium: 8.9 mg/dL (ref 8.9–10.3)
Chloride: 102 mmol/L (ref 101–111)
GFR calc Af Amer: >60 mL/min (ref >60)
GLUCOSE: 134 mg/dL — AB (ref 65–99)
POTASSIUM: 3.8 mmol/L (ref 3.5–5.1)
Sodium: 134 mmol/L — ABNORMAL LOW (ref 135–145)

## 2016-11-25 LAB — POC URINE PREG, ED: Preg Test, Ur: NEGATIVE

## 2016-11-25 MED ORDER — IBUPROFEN 200 MG PO TABS
600.0000 mg | ORAL_TABLET | Freq: Once | ORAL | Status: AC
  Administered 2016-11-25: 600 mg via ORAL
  Filled 2016-11-25: qty 3

## 2016-11-25 MED ORDER — FLUCONAZOLE 200 MG PO TABS: 200.0000 mg | ORAL_TABLET | Freq: Every day | ORAL | 0 refills | Status: AC

## 2016-11-25 MED ORDER — AMOXICILLIN-POT CLAVULANATE 875-125 MG PO TABS: 1.0000 | ORAL_TABLET | Freq: Two times a day (BID) | ORAL | 0 refills | Status: DC

## 2016-11-25 MED ORDER — AMOXICILLIN-POT CLAVULANATE 875-125 MG PO TABS
1.0000 | ORAL_TABLET | Freq: Once | ORAL | Status: AC
  Administered 2016-11-25: 1 via ORAL
  Filled 2016-11-25: qty 1

## 2016-11-25 NOTE — ED Triage Notes (Signed)
Patient c/o sore throat and body aches that started last night.

## 2016-11-25 NOTE — ED Notes (Signed)
Pt ambulatory and independent at discharge.  Verbalized understanding of discharge instructions 

## 2016-11-25 NOTE — ED Provider Notes (Signed)
[facility]-EMERGENCY DEPT Provider Note   CSN: [id number] Arrival date & time: 11/25/16  1830     History   Chief Complaint Chief Complaint  Patient presents with  . Sore Throat  . Generalized Body Aches    HPI Patricia Hayes is a 31 y.o. female who presents to the ED with sore throat and body aches. The symptoms started last night. Patient reports taking OTC flu medications without relief. Patient reports headache, fever, chills.   The history is provided by the patient. No language interpreter was used.  Sore Throat  This is a new problem. The current episode started yesterday. The problem occurs constantly. The problem has been gradually worsening. Associated symptoms include abdominal pain, headaches and shortness of breath. Pertinent negatives include no chest pain. The symptoms are aggravated by swallowing and eating. Nothing relieves the symptoms.    Past Medical History:  Diagnosis Date  . Anxiety   . Depression    age 31yo  . Eczema   . GERD (gastroesophageal reflux disease)   . Hiatal hernia   . History of urinary tract infection   . Migraine   . Uterine cyst   . Wears contact lenses     Patient Active Problem List   Diagnosis Date Noted  . Eczema 01/17/2014  . Irregular periods 01/17/2014  . Depression 01/17/2014  . Chronic nausea 01/17/2014  . Other migraine without status migrainosus, not intractable 01/17/2014    Past Surgical History:  Procedure Laterality Date  . ESOPHAGOGASTRODUODENOSCOPY  2014   hiatal hernia; Dr. Elnoria HowardHung  . TUBAL LIGATION      OB History    No data available       Home Medications    Prior to Admission medications   Medication Sig Start Date End Date Taking? Authorizing Provider  amoxicillin-clavulanate (AUGMENTIN) 875-125 MG tablet Take 1 tablet every 12 (twelve) hours by mouth. 11/25/16   Janne NapoleonNeese, Demarius Archila M, NP  aspirin-acetaminophen-caffeine (EXCEDRIN MIGRAINE) 5626047567250-250-65 MG tablet Take 1 tablet by  mouth every 6 (six) hours as needed for headache. Reported on 07/20/2015    [provider]  Crisaborole (EUCRISA) 2 % OINT Apply 1 application topically 2 (two) times daily. 12/14/15   Henson, Vickie L, NP-C  SUMAtriptan (IMITREX) 25 MG tablet May repeat in 2 hours if headache persists or recurs..  Max 2 daily 07/20/15   Tysinger, Kermit Baloavid S, PA-C  topiramate (TOPAMAX) 25 MG tablet Take 1 tablet (25 mg total) by mouth daily. 07/20/15   Tysinger, Kermit Baloavid S, PA-C    Family History Family History  Problem Relation Age of Onset  . Hypertension Mother   . Cancer Mother        uterine  . Other Father        unknown  . Cancer Sister        cervical  . Hypertension Brother   . Hyperlipidemia Brother   . Diabetes Brother   . Cancer Maternal Aunt        breast  . Hypertension Other   . Diabetes Other   . Stroke Maternal Grandfather   . Heart disease Maternal Grandfather   . Depression Sister     Social History Social History   Tobacco Use  . Smoking status: Current Every Day Smoker    Packs/day: 0.50    Years: 15.00    Pack years: 7.50    Types: Cigarettes  . Smokeless tobacco: Never Used  Substance Use Topics  . Alcohol use: Yes  Comment: occasional  . Drug use: No     Allergies   Patient has no known allergies.   Review of Systems Review of Systems  Constitutional: Positive for chills and fever.  HENT: Positive for congestion and sore throat. Negative for sinus pressure and sinus pain.   Eyes: Positive for photophobia. Negative for visual disturbance.  Respiratory: Positive for cough and shortness of breath. Negative for wheezing.   Cardiovascular: Negative for chest pain, palpitations and leg swelling.  Gastrointestinal: Positive for abdominal pain and nausea. Negative for constipation, diarrhea and vomiting.  Genitourinary: Negative for decreased urine volume, dysuria, frequency, vaginal bleeding and vaginal discharge.  Musculoskeletal: Positive for back pain and  myalgias. Negative for neck pain.  Skin: Negative for rash.  Neurological: Positive for light-headedness and headaches. Negative for syncope and weakness.  Hematological: Positive for adenopathy.  Psychiatric/Behavioral: Negative for confusion.     Physical Exam Updated Vital Signs BP 124/85 (BP Location: Left Arm)   Pulse (!) 104   Temp 100.3 F (37.9 C) (Oral)   Resp 17   Ht 5\' 10"  (1.778 m)   Wt 90.7 kg (200 lb)   LMP 10/19/2016   SpO2 98%   BMI 28.70 kg/m   Physical Exam  Constitutional: She appears well-developed and well-nourished. No distress.  HENT:  Head: Normocephalic and atraumatic.  Right Ear: Tympanic membrane normal.  Left Ear: Tympanic membrane normal.  Nose: Mucosal edema and rhinorrhea present.  Mouth/Throat: Uvula is midline. Posterior oropharyngeal erythema present. Tonsils are 2+ on the right. Tonsils are 3+ on the left. Tonsillar exudate.  Eyes: Conjunctivae and EOM are normal. Pupils are equal, round, and reactive to light.  Neck: Neck supple. No tracheal deviation present.  Cardiovascular: Regular rhythm. Tachycardia present.  Pulmonary/Chest: Effort normal. No respiratory distress. She has no wheezes.  Abdominal: Soft. Bowel sounds are normal. There is no tenderness.  Musculoskeletal: Normal range of motion.  Lymphadenopathy:    She has cervical adenopathy.  Neurological: She is alert.  Skin: Skin is warm and dry.  Psychiatric: She has a normal mood and affect. Her behavior is normal.  Nursing note and vitals reviewed.    ED Treatments / Results  Labs (all labs ordered are listed, but only abnormal results are displayed) Labs Reviewed  CBC WITH DIFFERENTIAL/PLATELET - Abnormal; Notable for the following components:      Result Value   WBC 19.6 (*)    Neutro Abs 16.4 (*)    Monocytes Absolute 1.6 (*)    All other components within normal limits  BASIC METABOLIC PANEL - Abnormal; Notable for the following components:   Sodium 134 (*)     Glucose, Bld 134 (*)    All other components within normal limits  RAPID STREP SCREEN (NOT AT [facility])  CULTURE, GROUP A STREP ([facility])  INFLUENZA PANEL BY PCR (TYPE A & B)  URINALYSIS, ROUTINE W REFLEX MICROSCOPIC  POC URINE PREG, ED   Radiology Dg Chest 2 View  Result Date: 11/25/2016 CLINICAL DATA:  Fever and myalgia EXAM: CHEST  2 VIEW COMPARISON:  Chest radiograph 01/16/2015 FINDINGS: The heart size and mediastinal contours are within normal limits. Both lungs are clear. The visualized skeletal structures are unremarkable. IMPRESSION: Normal chest. Electronically Signed   By: Deatra RobinsonKevin  Herman M.D.   On: 11/25/2016 22:39    Procedures Procedures (including critical care time)  Medications Ordered in ED Medications  amoxicillin-clavulanate (AUGMENTIN) 875-125 MG per tablet 1 tablet (not administered)  ibuprofen (ADVIL,MOTRIN) tablet 600 mg (600  mg Oral Given 11/25/16 2238)   Dr. Criss Alvine in to examine the patient and discuss lab findings and plan of care that includes antibiotics for tonsillitis and also treatment for flu like symptoms.   Initial Impression / Assessment and Plan / ED Course  I have reviewed the triage vital signs and the nursing notes.  SUBJECTIVE:  Patricia Hayes is a 31 y.o. female who present complaining of flu-like symptoms: fevers, chills, myalgias, congestion, sore throat and cough for 2 days. Denies dyspnea or wheezing.  OBJECTIVE: Appears moderately ill but not toxic; temperature as noted in vitals. Ears normal. Throat and pharynx with erythema and tonsils enlarged with exudate.  Neck supple.  Sinuses non tender. The chest is clear.  ASSESSMENT: Influenza  PLAN: Symptomatic therapy suggested: increase fluids, gargle prn for sore throat and use mist of vaporizer prn. F/u with PCP or return here if these symptoms worsen or fail to improve as anticipated. Will also treat with Augmentin for tonsillitis. Patient agrees with plan.   Final Clinical Impressions(s)  / ED Diagnoses   Final diagnoses:  Tonsillitis with exudate  Flu-like symptoms    ED Discharge Orders        Ordered    amoxicillin-clavulanate (AUGMENTIN) 875-125 MG tablet  Every 12 hours     11/25/16 2339       Kerrie Buffalo [place], NP 11/25/16 2346    Pricilla Loveless, MD 11/26/16 0003

## 2016-11-25 NOTE — Discharge Instructions (Signed)
We are treating you with antibiotics due to the enlarged tonsils and the exudate that we see on them. You also have flu like symptoms so we are giving you information about influenza. Follow up with your doctor. Return here as needed. Take tylenol and ibuprofen as needed for fever and pain.

## 2016-11-27 ENCOUNTER — Encounter (HOSPITAL_COMMUNITY): Payer: Self-pay | Admitting: Emergency Medicine

## 2016-11-27 ENCOUNTER — Other Ambulatory Visit: Payer: Self-pay

## 2016-11-27 ENCOUNTER — Emergency Department (HOSPITAL_COMMUNITY)
Admission: EM | Admit: 2016-11-27 | Discharge: 2016-11-27 | Disposition: A | Discharge status: Home / Self Care | Payer: 59 | Attending: Physician Assistant | Admitting: Physician Assistant

## 2016-11-27 DIAGNOSIS — J111 Influenza due to unidentified influenza virus with other respiratory manifestations: Secondary | ICD-10-CM | POA: Diagnosis not present

## 2016-11-27 DIAGNOSIS — F17210 Nicotine dependence, cigarettes, uncomplicated: Secondary | ICD-10-CM | POA: Insufficient documentation

## 2016-11-27 DIAGNOSIS — F419 Anxiety disorder, unspecified: Secondary | ICD-10-CM | POA: Diagnosis not present

## 2016-11-27 DIAGNOSIS — J029 Acute pharyngitis, unspecified: Secondary | ICD-10-CM | POA: Diagnosis present

## 2016-11-27 DIAGNOSIS — F329 Major depressive disorder, single episode, unspecified: Secondary | ICD-10-CM | POA: Diagnosis not present

## 2016-11-27 DIAGNOSIS — J039 Acute tonsillitis, unspecified: Secondary | ICD-10-CM

## 2016-11-27 DIAGNOSIS — R6889 Other general symptoms and signs: Secondary | ICD-10-CM

## 2016-11-27 MED ORDER — LIDOCAINE VISCOUS 2 % MT SOLN: 15.0000 mL | OROMUCOSAL | 0 refills | Status: DC | PRN

## 2016-11-27 MED ORDER — CLINDAMYCIN HCL 150 MG PO CAPS
300.0000 mg | ORAL_CAPSULE | Freq: Once | ORAL | Status: AC
  Administered 2016-11-27: 300 mg via ORAL
  Filled 2016-11-27: qty 2

## 2016-11-27 MED ORDER — PREDNISONE 20 MG PO TABS: 40.0000 mg | ORAL_TABLET | Freq: Every day | ORAL | 0 refills | Status: DC

## 2016-11-27 MED ORDER — PREDNISONE 20 MG PO TABS
60.0000 mg | ORAL_TABLET | Freq: Once | ORAL | Status: AC
  Administered 2016-11-27: 60 mg via ORAL
  Filled 2016-11-27: qty 3

## 2016-11-27 MED ORDER — CLINDAMYCIN HCL 300 MG PO CAPS: 300.0000 mg | ORAL_CAPSULE | Freq: Three times a day (TID) | ORAL | 0 refills | Status: AC

## 2016-11-27 NOTE — Discharge Instructions (Signed)
You were seen here today for your continued symptoms.  I am switching your antibiotic to clindamycin. Please take all of your antibiotics until finished!   You may develop abdominal discomfort or diarrhea from the antibiotic.  You may help offset this with probiotics which you can buy or get in yogurt. Do not eat or take the probiotics until 2 hours after your antibiotic. Do not take your medicine if develop an itchy rash, swelling in your mouth or lips, or difficulty breathing.  I am also starting you on a burst of steroids.  This medication can cause irritability, weight gain, increased hunger, difficulty with sleeping.  I am also prescribing you this is lidocaine that you can swish, gargle and spit out as needed for throat pain.  Follow directions.  Do not swallow.  Take Tylenol/ibuprofen as needed for body aches and fever.  Please follow-up with your PCP this week.  Return instructions:  Return to the ED sooner for worsening condition, inability to swallow, breathing difficulty, chest pain or new concerns.  Additional Information:  Your vital signs today were: BP 132/81 (BP Location: Right Arm)    Pulse 87    Temp 99.7 F (37.6 C) (Oral)    Resp 15    Ht 5\' 10"  (1.778 m)    Wt 90.7 kg (200 lb)    LMP 10/27/2016 (Approximate)    SpO2 100%    BMI 28.70 kg/m  If your blood pressure (BP) was elevated above 135/85 this visit, please have this repeated by your doctor within one month. ---------------

## 2016-11-27 NOTE — ED Triage Notes (Signed)
Patient reports persistent sore throat with fever , body aches , chills , occasional dry cough , [facility] prescribed with Amoxicillin with no improvement .

## 2016-11-27 NOTE — ED Provider Notes (Signed)
[facility] Provider Note   CSN: [id number] Arrival date & time: 11/27/16  0620     History   Chief Complaint Chief Complaint  Patient presents with  . Sore Throat    Fever  . Generalized Body Aches  . Cough    HPI Leron CroakShatiera Holman is a 31 y.o. female who presents emergency department today for ongoing symptoms.  Patient was seen here on 11/25/2016 for sore throat and body aches.  At that time the patient was found to be febrile of 100.3.  Patient had CBC, BMP, rapid strep, influenza, urinalysis, chest x-ray done in order to evaluate for this.  She was found to have a leukocytosis of 19.6 with otherwise normal findings.  Strep culture pending.  Patient was diagnosed with flulike illness and given Augmentin for tonsillitis with exudates.  She is presenting to the emergency department today because she feels like her symptoms are worsening.  Patient states that when she initially presented she felt like her symptoms were worse in the left side.  She now feels like her sore throat is on both sides.  She notes associated dysphasia.  Since being home she has not been with fever.  She notes one episode of chills yesterday.  She has been taking her Augmentin as prescribed and also taking TheraFlu.  She has had continued body aches and notes associated non-productive cough.  Denies headache, neck stiffness, chest pain, shortness of breath, hemoptysis, inability to control secretions, nausea, vomiting, abdominal pain, congestion, voice change, trauma.  Patient has not had a rash since starting Augmentin.  She notes no fatigue preceding sore throat.  No sick contacts.  She has been eating and drinking appropriately.  HPI  Past Medical History:  Diagnosis Date  . Anxiety   . Depression    age 31yo  . Eczema   . GERD (gastroesophageal reflux disease)   . Hiatal hernia   . History of urinary tract infection   . Migraine   . Uterine cyst   . Wears contact  lenses     Patient Active Problem List   Diagnosis Date Noted  . Eczema 01/17/2014  . Irregular periods 01/17/2014  . Depression 01/17/2014  . Chronic nausea 01/17/2014  . Other migraine without status migrainosus, not intractable 01/17/2014    Past Surgical History:  Procedure Laterality Date  . ESOPHAGOGASTRODUODENOSCOPY  2014   hiatal hernia; Dr. Elnoria HowardHung  . TUBAL LIGATION      OB History    No data available       Home Medications    Prior to Admission medications   Medication Sig Start Date End Date Taking? Authorizing Provider  amoxicillin-clavulanate (AUGMENTIN) 875-125 MG tablet Take 1 tablet every 12 (twelve) hours by mouth. 11/25/16  Yes Neese, DeerHope M, NP  Phenylephrine-Pheniramine-DM [facility]THERAFLU COLD & COUGH) 11-02-18 MG PACK Take 1 Package as needed by mouth (for cough).   Yes [provider]  aspirin-acetaminophen-caffeine (EXCEDRIN MIGRAINE) 5807844837250-250-65 MG tablet Take 1 tablet by mouth every 6 (six) hours as needed for headache. Reported on 07/20/2015    [provider]  Crisaborole (EUCRISA) 2 % OINT Apply 1 application topically 2 (two) times daily. Patient not taking: Reported on 11/27/2016 12/14/15   Avanell ShackletonHenson, Vickie L, NP-C  SUMAtriptan (IMITREX) 25 MG tablet May repeat in 2 hours if headache persists or recurs..  Max 2 daily Patient not taking: Reported on 11/27/2016 07/20/15   Tysinger, Kermit Baloavid S, PA-C  topiramate (TOPAMAX) 25 MG tablet  Take 1 tablet (25 mg total) by mouth daily. Patient not taking: Reported on 11/27/2016 07/20/15   Tysinger, Kermit Balo, PA-C    Family History Family History  Problem Relation Age of Onset  . Hypertension Mother   . Cancer Mother        uterine  . Other Father        unknown  . Cancer Sister        cervical  . Hypertension Brother   . Hyperlipidemia Brother   . Diabetes Brother   . Cancer Maternal Aunt        breast  . Hypertension Other   . Diabetes Other   . Stroke Maternal Grandfather   . Heart disease  Maternal Grandfather   . Depression Sister     Social History Social History   Tobacco Use  . Smoking status: Current Every Day Smoker    Packs/day: 0.50    Years: 15.00    Pack years: 7.50    Types: Cigarettes  . Smokeless tobacco: Never Used  Substance Use Topics  . Alcohol use: Yes    Comment: occasional  . Drug use: No     Allergies   Patient has no known allergies.   Review of Systems Review of Systems  All other systems reviewed and are negative.    Physical Exam Updated Vital Signs BP 132/81 (BP Location: Right Arm)   Pulse 87   Temp 99.7 F (37.6 C) (Oral)   Resp 15   Ht 5\' 10"  (1.778 m)   Wt 90.7 kg (200 lb)   LMP 10/27/2016 (Approximate)   SpO2 100%   BMI 28.70 kg/m   Physical Exam  Constitutional: She appears well-developed and well-nourished. No distress.  HENT:  Head: Normocephalic and atraumatic.  Right Ear: Hearing, tympanic membrane, external ear and ear canal normal. No foreign bodies. Tympanic membrane is not injected, not perforated, not erythematous, not retracted and not bulging.  Left Ear: Hearing, tympanic membrane, external ear and ear canal normal. No foreign bodies. Tympanic membrane is not injected, not perforated, not erythematous, not retracted and not bulging.  Nose: Mucosal edema and rhinorrhea present. No sinus tenderness, septal deviation or nasal septal hematoma.  No foreign bodies. Right sinus exhibits no maxillary sinus tenderness and no frontal sinus tenderness. Left sinus exhibits no maxillary sinus tenderness and no frontal sinus tenderness.  Mouth/Throat: Uvula is midline, oropharynx is clear and moist and mucous membranes are normal. No tonsillar exudate.  The patient has normal phonation is in control secretions.  No stridor.  Midline uvula without edema.  Soft palate rise symmetrically.  There is mild tonsillar erythema with exudates.  No peritonsillar abscess.  Tongue protrusion normal.  No trismus.  No crepitus on neck  palpation.  Patient has good dentition.  No floor edema. No gingival erythema or fluctuance noted. Mucous membranes moist.  Eyes: Conjunctivae, EOM and lids are normal. Pupils are equal, round, and reactive to light. Right eye exhibits no discharge. Left eye exhibits no discharge. Right conjunctiva is not injected. Left conjunctiva is not injected. No scleral icterus.  Neck: Trachea normal, normal range of motion, full passive range of motion without pain and phonation normal. Neck supple. No spinous process tenderness and no muscular tenderness present. No neck rigidity. No tracheal deviation and normal range of motion present.  No meningismus  Cardiovascular: Normal rate, regular rhythm and intact distal pulses.  No murmur heard. Pulses:      Radial pulses are 2+ on the  right side, and 2+ on the left side.       Dorsalis pedis pulses are 2+ on the right side, and 2+ on the left side.       Posterior tibial pulses are 2+ on the right side, and 2+ on the left side.  No lower extremity swelling or edema. Calves symmetric in size bilaterally.  Pulmonary/Chest: Effort normal and breath sounds normal. No stridor. She has no wheezes. She exhibits no tenderness.  Abdominal: Soft. Bowel sounds are normal. There is no tenderness. There is no rebound and no guarding.  No splenomegaly.   Musculoskeletal: She exhibits no edema.  Lymphadenopathy:  Shotty cervical lymphadenopathy  Neurological: She is alert.  Skin: Skin is warm, dry and intact. No rash noted. She is not diaphoretic.  No blisters, no pustules, no warmth, no draining sinus tracts, no superficial abscesses, no bullous impetigo, no vesicles, no desquamation, no target lesions with dusky purpura or a central bulla.   Psychiatric: She has a normal mood and affect.  Nursing note and vitals reviewed.    ED Treatments / Results  Labs (all labs ordered are listed, but only abnormal results are displayed) Labs Reviewed - No data to  display  EKG  EKG Interpretation None       Radiology Dg Chest 2 View  Result Date: 11/25/2016 CLINICAL DATA:  Fever and myalgia EXAM: CHEST  2 VIEW COMPARISON:  Chest radiograph 01/16/2015 FINDINGS: The heart size and mediastinal contours are within normal limits. Both lungs are clear. The visualized skeletal structures are unremarkable. IMPRESSION: Normal chest. Electronically Signed   By: Deatra Robinson M.D.   On: 11/25/2016 22:39    Procedures Procedures (including critical care time)  Medications Ordered in ED Medications - No data to display   Initial Impression / Assessment and Plan / ED Course  I have reviewed the triage vital signs and the nursing notes.  Pertinent labs & imaging results that were available during my care of the patient were reviewed by me and considered in my medical decision making (see chart for details).     32 y.o. female with continued sore throat and body aches.  Patient was seen here on 11/25/2016 for same  At that time the patient was found to be febrile of 100.3.  Patient had CBC, BMP, rapid strep, influenza, urinalysis, chest x-ray done in order to evaluate for this.  She was found to have a leukocytosis of 19.6 with otherwise normal findings.  Strep culture pending.  Patient was diagnosed with flulike illness and given Augmentin for tonsillitis with exudates.  She is having continued symptoms.  Vital signs today are reassuring.  She is without fever, tachycardia, tachypnea, hypoxia or hypotension.  She is non-ill and nonseptic appearing.  On exam the patient does have tonsillar erythema with exudates and shotty cervical lymphadenopathy.  Patient has normal phonation is in control secretions.  There is no trismus.  She has midline uvula.  No evidence of peritonsillar abscess.  Do not suspect deep space infection.  No neck stiffness.  Lungs are clear to auscultation bilaterally.   I discussed with the patient in length about benefits of repeat labs,  chest x-ray and workup for her symptoms vs outpatient trial of switching from Augmentin to clindamycin and adding on steroids to help with tonsillitis.  She states she would like to try outpatient trial of antibiotics and steroids and follow with her PCP.  I gave the patient strict return precautions. She agreed to follow  up with PCP. Patient can handle PO fluids and appears safe for discharge.   Final Clinical Impressions(s) / ED Diagnoses   Final diagnoses:  Flu-like symptoms  Tonsillitis with exudate    ED Discharge Orders        Ordered    clindamycin (CLEOCIN) 300 MG capsule  3 times daily     11/27/16 0744    predniSONE (DELTASONE) 20 MG tablet  Daily     11/27/16 0744    lidocaine (XYLOCAINE) 2 % solution  As needed     11/27/16 0744       Jacinto HalimMaczis, Krissia Schreier M, PA-C 11/27/16 0749    Abelino DerrickMackuen, Courteney Lyn, MD 11/27/16 1034

## 2016-11-28 LAB — CULTURE, GROUP A STREP (THRC)

## 2016-12-08 ENCOUNTER — Ambulatory Visit (INDEPENDENT_AMBULATORY_CARE_PROVIDER_SITE_OTHER): Payer: 59 | Admitting: Medical

## 2016-12-08 ENCOUNTER — Encounter: Payer: Self-pay | Admitting: Medical

## 2016-12-08 VITALS — BP 124/82 | HR 113 | Temp 99.2°F | Wt 192.4 lb

## 2016-12-08 DIAGNOSIS — J029 Acute pharyngitis, unspecified: Secondary | ICD-10-CM

## 2016-12-08 DIAGNOSIS — D72829 Elevated white blood cell count, unspecified: Secondary | ICD-10-CM | POA: Diagnosis not present

## 2016-12-08 DIAGNOSIS — B2790 Infectious mononucleosis, unspecified without complication: Secondary | ICD-10-CM | POA: Diagnosis not present

## 2016-12-08 DIAGNOSIS — B370 Candidal stomatitis: Secondary | ICD-10-CM | POA: Diagnosis not present

## 2016-12-08 LAB — CBC WITH DIFFERENTIAL/PLATELET
BASOS ABS: 50 {cells}/uL (ref 0–200)
Basophils Relative: 0.4 %
EOS PCT: 0.5 %
Eosinophils Absolute: 63 cells/uL (ref 15–500)
HCT: 40.2 % (ref 35.0–45.0)
HEMOGLOBIN: 13.6 g/dL (ref 11.7–15.5)
Lymphs Abs: 1600 cells/uL (ref 850–3900)
MCH: 28.2 pg (ref 27.0–33.0)
MCHC: 33.8 g/dL (ref 32.0–36.0)
MCV: 83.4 fL (ref 80.0–100.0)
MONOS PCT: 5.4 %
MPV: 9.5 fL (ref 7.5–12.5)
NEUTROS PCT: 81 %
Neutro Abs: 10206 cells/uL — ABNORMAL HIGH (ref 1500–7800)
Platelets: 294 10*3/uL (ref 140–400)
RBC: 4.82 10*6/uL (ref 3.80–5.10)
RDW: 12.8 % (ref 11.0–15.0)
Total Lymphocyte: 12.7 %
WBC mixed population: 680 cells/uL (ref 200–950)
WBC: 12.6 10*3/uL — ABNORMAL HIGH (ref 3.8–10.8)

## 2016-12-08 LAB — POCT MONO (EPSTEIN BARR VIRUS): MONO, POC: POSITIVE — AB

## 2016-12-08 MED ORDER — CLOTRIMAZOLE 10 MG MT TROC: 10.0000 mg | Freq: Every day | OROMUCOSAL | 0 refills | Status: DC

## 2016-12-08 NOTE — Progress Notes (Signed)
Subjective: Chief Complaint  Patient presents with  . Hospitalization Follow-up    follow up  sore throat, acid reflux. nausea  vomitting   Here for ED f/u.  Went to the emergency dept 11/13 and 11/15 for horrible sore throat.  Was diagnosed with strep, put on 1 antibiotic initially,then was changed to Augmentin on 11/27/16.   She notes some improvement but still has bad sore throat, fatigue, and lack of appetite.  Feels run down.   No fever, no chest pain, no cough, no ear pain.   + nausea, but no vomiting or diarrhea.  No sick contacts.  Denies sharing drinks with others but does have significant other.  She does note burning sensation on tongue.  No other aggravating or relieving factors. No other complaint.  Past Medical History:  Diagnosis Date  . Anxiety   . Depression    age 31yo  . Eczema   . GERD (gastroesophageal reflux disease)   . Hiatal hernia   . History of urinary tract infection   . Migraine   . Uterine cyst   . Wears contact lenses    Current Outpatient Medications on File Prior to Visit  Medication Sig Dispense Refill  . amoxicillin-clavulanate (AUGMENTIN) 875-125 MG tablet Take 1 tablet every 12 (twelve) hours by mouth. 14 tablet 0  . aspirin-acetaminophen-caffeine (EXCEDRIN MIGRAINE) 250-250-65 MG tablet Take 1 tablet by mouth every 6 (six) hours as needed for headache. Reported on 07/20/2015    . Crisaborole (EUCRISA) 2 % OINT Apply 1 application topically 2 (two) times daily. (Patient not taking: Reported on 11/27/2016) 10 g 0  . lidocaine (XYLOCAINE) 2 % solution Use as directed 15 mLs as needed in the mouth or throat for mouth pain. (Patient not taking: Reported on 12/08/2016) 100 mL 0   No current facility-administered medications on file prior to visit.    ROS as in subjective    Objective: BP 124/82   Pulse (!) 113   Temp 99.2 F (37.3 C)   Wt 192 lb 6.4 oz (87.3 kg)   SpO2 96%   BMI 27.61 kg/m   General appearance: alert, no distress, WD/WN,   HEENT: normocephalic, sclerae anicteric, TMs pearly, nares patent, no discharge or erythema, pharynx with mild erythema, no exudate on tonsils Oral cavity: MMM, tongue with beefy red appearance and some patchy whitish coloration suggestive of thrush Neck: supple, shoddy tender anterior nodes, no thyromegaly, no masses Heart: RRR, normal S1, S2, no murmurs Lungs: CTA bilaterally, no wheezes, rhonchi, or rales Abdomen: +bs, soft, non tender, non distended, no masses, no hepatomegaly, no splenomegaly Pulses: 2+ symmetric, upper and lower extremities, normal cap refill Ext: no edema    Assessment: Encounter Diagnoses  Name Primary?  . Sore throat Yes  . Leukocytosis, unspecified type   . Thrush   . Mononucleosis     Plan: Reviewed ED report and labs from 11/13 and 11/27/16 Mono + today.   Additional labs today given findings. Begin Mycelex troche for thrust.  Finish antibiotic tomorrow Discussed diagnosis and treatment of mono, possible complications.   Currently seems mildly ill.   Discuss supportive care, rest, hydration. If not seeing some improvement in the next week or if worse recheck We will call with results.   Sherrill RaringShatiera was seen today for hospitalization follow-up.  Diagnoses and all orders for this visit:  Sore throat -     CBC with Differential/Platelet -     Mono (Epstein Barr Virus) -     HIV  antibody  Leukocytosis, unspecified type -     CBC with Differential/Platelet -     HIV antibody  Thrush -     CBC with Differential/Platelet -     HIV antibody  Mononucleosis -     HIV antibody  Other orders -     clotrimazole (MYCELEX) 10 MG troche; Take 1 tablet (10 mg total) by mouth 5 (five) times daily.

## 2016-12-09 LAB — HIV ANTIBODY (ROUTINE TESTING W REFLEX): HIV 1&2 Ab, 4th Generation: NONREACTIVE

## 2017-05-26 ENCOUNTER — Telehealth: Payer: Self-pay | Admitting: Medical

## 2017-05-26 ENCOUNTER — Encounter (HOSPITAL_COMMUNITY): Payer: Self-pay | Admitting: Emergency Medicine

## 2017-05-26 ENCOUNTER — Other Ambulatory Visit: Payer: Self-pay | Admitting: Medical

## 2017-05-26 ENCOUNTER — Other Ambulatory Visit: Payer: Self-pay

## 2017-05-26 ENCOUNTER — Emergency Department (HOSPITAL_COMMUNITY)
Admission: EM | Admit: 2017-05-26 | Discharge: 2017-05-26 | Disposition: A | Discharge status: Home / Self Care | Payer: 59 | Attending: Emergency Medicine | Admitting: Emergency Medicine

## 2017-05-26 ENCOUNTER — Encounter: Payer: Self-pay | Admitting: Medical

## 2017-05-26 DIAGNOSIS — B9789 Other viral agents as the cause of diseases classified elsewhere: Secondary | ICD-10-CM | POA: Diagnosis not present

## 2017-05-26 DIAGNOSIS — Z79899 Other long term (current) drug therapy: Secondary | ICD-10-CM | POA: Diagnosis not present

## 2017-05-26 DIAGNOSIS — J029 Acute pharyngitis, unspecified: Secondary | ICD-10-CM | POA: Diagnosis not present

## 2017-05-26 DIAGNOSIS — R070 Pain in throat: Secondary | ICD-10-CM | POA: Diagnosis present

## 2017-05-26 DIAGNOSIS — F17210 Nicotine dependence, cigarettes, uncomplicated: Secondary | ICD-10-CM | POA: Diagnosis not present

## 2017-05-26 DIAGNOSIS — J028 Acute pharyngitis due to other specified organisms: Secondary | ICD-10-CM | POA: Diagnosis not present

## 2017-05-26 LAB — GROUP A STREP BY PCR: Group A Strep by PCR: NOT DETECTED

## 2017-05-26 MED ORDER — GUAIFENESIN-CODEINE 100-10 MG/5ML PO SOLN
5.0000 mL | Freq: Three times a day (TID) | ORAL | 0 refills | Status: DC | PRN
Start: 2017-05-26 — End: 2018-12-15

## 2017-05-26 MED ORDER — OXYCODONE-ACETAMINOPHEN 5-325 MG PO TABS
1.0000 | ORAL_TABLET | Freq: Once | ORAL | Status: AC
  Administered 2017-05-26: 1 via ORAL
  Filled 2017-05-26: qty 1

## 2017-05-26 NOTE — Discharge Instructions (Addendum)
Please read attached information. If you experience any new or worsening signs or symptoms please return to the emergency room for evaluation. Please follow-up with your primary care provider or specialist as discussed. Please use medication prescribed only as directed and discontinue taking if you have any concerning signs or symptoms.   °

## 2017-05-26 NOTE — ED Provider Notes (Signed)
[facility] EMERGENCY DEPARTMENT Provider Note   CSN: [id number] Arrival date & time: 05/26/17  1015     History   Chief Complaint Chief Complaint  Patient presents with  . Sore Throat    HPI Patricia Hayes is a 32 y.o. female.  HPI   32 year old female presents today with complaints of sore throat.  Patient notes 3-day history of sore throat, exudate, dry nonproductive cough.  Patient notes she feels hot but denies any significant fevers.  She notes she is been taking Alka-Seltzer cough and cold.  He notes significant past medical history of recurring tonsillitis infections.  Most recently in November of last year she had strep pharyngitis and was treated with antibiotics.  Denies any trouble breathing or swallowing.  Past Medical History:  Diagnosis Date  . Anxiety   . Depression    age 67yo  . Eczema   . GERD (gastroesophageal reflux disease)   . Hiatal hernia   . History of urinary tract infection   . Migraine   . Uterine cyst   . Wears contact lenses     Patient Active Problem List   Diagnosis Date Noted  . Eczema 01/17/2014  . Irregular periods 01/17/2014  . Depression 01/17/2014  . Chronic nausea 01/17/2014  . Other migraine without status migrainosus, not intractable 01/17/2014    Past Surgical History:  Procedure Laterality Date  . ESOPHAGOGASTRODUODENOSCOPY  2014   hiatal hernia; Dr. Elnoria Howard  . TUBAL LIGATION       OB History   None      Home Medications    Prior to Admission medications   Medication Sig Start Date End Date Taking? Authorizing Provider  amoxicillin-clavulanate (AUGMENTIN) 875-125 MG tablet Take 1 tablet every 12 (twelve) hours by mouth. 11/25/16   Janne Napoleon, NP  aspirin-acetaminophen-caffeine (EXCEDRIN MIGRAINE) [phone] MG tablet Take 1 tablet by mouth every 6 (six) hours as needed for headache. Reported on 07/20/2015    [provider]  clotrimazole (MYCELEX) 10 MG troche Take 1 tablet (10 mg  total) by mouth 5 (five) times daily. 12/08/16   Tysinger, Kermit Balo, PA-C  Crisaborole (EUCRISA) 2 % OINT Apply 1 application topically 2 (two) times daily. Patient not taking: Reported on 11/27/2016 12/14/15   Hetty Blend L, NP-C  guaiFENesin-codeine 100-10 MG/5ML syrup Take 5 mLs by mouth 3 (three) times daily as needed for cough. 05/26/17   Devynne Sturdivant, Tinnie Gens, PA-C  lidocaine (XYLOCAINE) 2 % solution Use as directed 15 mLs as needed in the mouth or throat for mouth pain. Patient not taking: Reported on 12/08/2016 11/27/16   Maczis, Elmer Sow, PA-C    Family History Family History  Problem Relation Age of Onset  . Hypertension Mother   . Cancer Mother        uterine  . Other Father        unknown  . Cancer Sister        cervical  . Hypertension Brother   . Hyperlipidemia Brother   . Diabetes Brother   . Cancer Maternal Aunt        breast  . Hypertension Other   . Diabetes Other   . Stroke Maternal Grandfather   . Heart disease Maternal Grandfather   . Depression Sister     Social History Social History   Tobacco Use  . Smoking status: Current Every Day Smoker    Packs/day: 0.50    Years: 15.00    Pack years: 7.50  Types: Cigarettes  . Smokeless tobacco: Never Used  Substance Use Topics  . Alcohol use: Yes    Comment: occasional  . Drug use: No     Allergies   Patient has no known allergies.   Review of Systems Review of Systems  All other systems reviewed and are negative.    Physical Exam Updated Vital Signs BP 110/73   Pulse 94   Temp 99.7 F (37.6 C) (Oral)   Resp 15   SpO2 99%   Physical Exam  Constitutional: She is oriented to person, place, and time. She appears well-developed and well-nourished.  HENT:  Head: Normocephalic and atraumatic.  Bilateral tonsillar swelling, symmetrical, exudate noted, no pooling of secretions, no signs of RPA PTA.  Neck supple, bilateral anterior lymphadenopathy, neck full active range of motion  Eyes: Pupils  are equal, round, and reactive to light. Conjunctivae are normal. Right eye exhibits no discharge. Left eye exhibits no discharge. No scleral icterus.  Neck: Normal range of motion. No JVD present. No tracheal deviation present.  Pulmonary/Chest: Effort normal and breath sounds normal. No stridor. No respiratory distress. She has no wheezes. She has no rales. She exhibits no tenderness.  Neurological: She is alert and oriented to person, place, and time. Coordination normal.  Psychiatric: She has a normal mood and affect. Her behavior is normal. Judgment and thought content normal.  Nursing note and vitals reviewed.    ED Treatments / Results  Labs (all labs ordered are listed, but only abnormal results are displayed) Labs Reviewed  GROUP A STREP BY PCR    EKG None  Radiology No results found.  Procedures Procedures (including critical care time)  Medications Ordered in ED Medications  oxyCODONE-acetaminophen (PERCOCET/ROXICET) 5-325 MG per tablet 1 tablet (1 tablet Oral Given 05/26/17 1406)     Initial Impression / Assessment and Plan / ED Course  I have reviewed the triage vital signs and the nursing notes.  Pertinent labs & imaging results that were available during my care of the patient were reviewed by me and considered in my medical decision making (see chart for details).     32 year old female presents today with likely viral pharyngitis.  Patient does have other upper respiratory symptoms as well.  She has a negative group A strep here.  Patient given cough medication with codeine for pain.  Encouraged use ibuprofen.  Patient will return in 3 days if symptoms persist or immediately if they worsen.  She verbalized understanding and agreement to today's plan had no further questions or concerns at the time of discharge  Final Clinical Impressions(s) / ED Diagnoses   Final diagnoses:  Viral pharyngitis    ED Discharge Orders        Ordered    guaiFENesin-codeine  100-10 MG/5ML syrup  3 times daily PRN     05/26/17 1349       Rosalio Loud 05/26/17 1415    Rolland Porter, MD 06/01/17 [phone]

## 2017-05-26 NOTE — ED Triage Notes (Signed)
Pt reports sore throat x3 days, tonsils red and swollen with white patches present. Pt unsure if shes had fevers at home.

## 2017-05-26 NOTE — Telephone Encounter (Signed)
pls mail letter I typed today about emergency dept visit.

## 2017-05-27 NOTE — Telephone Encounter (Signed)
Done

## 2018-01-13 ENCOUNTER — Emergency Department (HOSPITAL_COMMUNITY)
Admission: EM | Admit: 2018-01-13 | Discharge: 2018-01-14 | Disposition: A | Discharge status: Home / Self Care | Payer: 59 | Attending: Emergency Medicine | Admitting: Emergency Medicine

## 2018-01-13 ENCOUNTER — Encounter (HOSPITAL_COMMUNITY): Payer: Self-pay

## 2018-01-13 ENCOUNTER — Other Ambulatory Visit: Payer: Self-pay

## 2018-01-13 DIAGNOSIS — Z79899 Other long term (current) drug therapy: Secondary | ICD-10-CM | POA: Insufficient documentation

## 2018-01-13 DIAGNOSIS — N920 Excessive and frequent menstruation with regular cycle: Secondary | ICD-10-CM | POA: Diagnosis not present

## 2018-01-13 DIAGNOSIS — F17210 Nicotine dependence, cigarettes, uncomplicated: Secondary | ICD-10-CM | POA: Insufficient documentation

## 2018-01-13 DIAGNOSIS — Z86018 Personal history of other benign neoplasm: Secondary | ICD-10-CM

## 2018-01-13 DIAGNOSIS — R102 Pelvic and perineal pain: Secondary | ICD-10-CM

## 2018-01-13 DIAGNOSIS — R112 Nausea with vomiting, unspecified: Secondary | ICD-10-CM | POA: Diagnosis not present

## 2018-01-13 LAB — COMPREHENSIVE METABOLIC PANEL
ALBUMIN: 3.5 g/dL (ref 3.5–5.0)
ALT: 18 U/L (ref 0–44)
AST: 20 U/L (ref 15–41)
Alkaline Phosphatase: 57 U/L (ref 38–126)
Anion gap: 9 (ref 5–15)
BUN: 11 mg/dL (ref 6–20)
CO2: 23 mmol/L (ref 22–32)
Calcium: 8.7 mg/dL — ABNORMAL LOW (ref 8.9–10.3)
Chloride: 110 mmol/L (ref 98–111)
Creatinine, Ser: 0.77 mg/dL (ref 0.44–1.00)
GFR calc Af Amer: >60 mL/min (ref >60)
GFR calc non Af Amer: >60 mL/min (ref >60)
GLUCOSE: 120 mg/dL — AB (ref 70–99)
POTASSIUM: 3.6 mmol/L (ref 3.5–5.1)
SODIUM: 142 mmol/L (ref 135–145)
Total Bilirubin: 0.4 mg/dL (ref 0.3–1.2)
Total Protein: 6.8 g/dL (ref 6.5–8.1)

## 2018-01-13 LAB — CBC
HEMATOCRIT: 37.5 % (ref 36.0–46.0)
HEMOGLOBIN: 12 g/dL (ref 12.0–15.0)
MCH: 28.6 pg (ref 26.0–34.0)
MCHC: 32 g/dL (ref 30.0–36.0)
MCV: 89.3 fL (ref 80.0–100.0)
Platelets: 277 10*3/uL (ref 150–400)
RBC: 4.2 MIL/uL (ref 3.87–5.11)
RDW: 13.1 % (ref 11.5–15.5)
WBC: 11.2 10*3/uL — ABNORMAL HIGH (ref 4.0–10.5)
nRBC: 0 % (ref 0.0–0.2)

## 2018-01-13 LAB — WET PREP, GENITAL
Clue Cells Wet Prep HPF POC: NONE SEEN
SPERM: NONE SEEN
Trich, Wet Prep: NONE SEEN
WBC, Wet Prep HPF POC: NONE SEEN
Yeast Wet Prep HPF POC: NONE SEEN

## 2018-01-13 LAB — URINALYSIS, ROUTINE W REFLEX MICROSCOPIC
Bilirubin Urine: NEGATIVE
GLUCOSE, UA: NEGATIVE mg/dL
KETONES UR: NEGATIVE mg/dL
Nitrite: NEGATIVE
PROTEIN: NEGATIVE mg/dL
Specific Gravity, Urine: 1.028 (ref 1.005–1.030)
pH: 5 (ref 5.0–8.0)

## 2018-01-13 LAB — I-STAT BETA HCG BLOOD, ED (MC, WL, AP ONLY)

## 2018-01-13 LAB — LIPASE, BLOOD: Lipase: 40 U/L (ref 11–51)

## 2018-01-13 MED ORDER — HYDROCODONE-ACETAMINOPHEN 5-325 MG PO TABS
1.0000 | ORAL_TABLET | Freq: Once | ORAL | Status: AC
  Administered 2018-01-13: 1 via ORAL
  Filled 2018-01-13: qty 1

## 2018-01-13 MED ORDER — NAPROXEN 500 MG PO TABS: 500.0000 mg | ORAL_TABLET | Freq: Two times a day (BID) | ORAL | 0 refills | Status: DC

## 2018-01-13 NOTE — ED Notes (Signed)
Requested that patient get a urine sample; she states that she is unable to get a sample at this time.

## 2018-01-13 NOTE — ED Notes (Signed)
Pelvic supplies at bedside. 

## 2018-01-13 NOTE — ED Provider Notes (Signed)
[facility]-EMERGENCY DEPT Provider Note   CSN: [id number] Arrival date & time: 01/13/18  1957     History   Chief Complaint Chief Complaint  Patient presents with  . Abdominal Pain    HPI Patricia Hayes is a 33 y.o. female.  Patient with a history of uterine fibroids, migraine headaches, depression presents with lower abdominal pain that started 3 days ago, radiating to her lower back. No urinary symptoms, diarrhea, constipation. She has had mild nausea since onset, with vomiting only on the first day. No fever, SOB, CP, cough. No vaginal discharge. She started having vaginal bleeding late last night coincident with the expected time of her menses. She has normally heavy flow with clots and current bleeding is typical.   The history is provided by the patient. No language interpreter was used.  Abdominal Pain   Associated symptoms include nausea. Pertinent negatives include fever, dysuria and frequency. Vomiting: See HPI.    Past Medical History:  Diagnosis Date  . Anxiety   . Depression    age 52yo  . Eczema   . GERD (gastroesophageal reflux disease)   . Hiatal hernia   . History of urinary tract infection   . Migraine   . Uterine cyst   . Wears contact lenses     Patient Active Problem List   Diagnosis Date Noted  . Eczema 01/17/2014  . Irregular periods 01/17/2014  . Depression 01/17/2014  . Chronic nausea 01/17/2014  . Other migraine without status migrainosus, not intractable 01/17/2014    Past Surgical History:  Procedure Laterality Date  . ESOPHAGOGASTRODUODENOSCOPY  2014   hiatal hernia; Dr. Elnoria Howard  . TUBAL LIGATION       OB History   No obstetric history on file.      Home Medications    Prior to Admission medications   Medication Sig Start Date End Date Taking? Authorizing Provider  amoxicillin-clavulanate (AUGMENTIN) 875-125 MG tablet Take 1 tablet every 12 (twelve) hours by mouth. 11/25/16   Janne Napoleon, NP    aspirin-acetaminophen-caffeine (EXCEDRIN MIGRAINE) [phone] MG tablet Take 1 tablet by mouth every 6 (six) hours as needed for headache. Reported on 07/20/2015    [provider]  clotrimazole (MYCELEX) 10 MG troche Take 1 tablet (10 mg total) by mouth 5 (five) times daily. 12/08/16   Tysinger, Kermit Balo, PA-C  Crisaborole (EUCRISA) 2 % OINT Apply 1 application topically 2 (two) times daily. Patient not taking: Reported on 11/27/2016 12/14/15   Hetty Blend L, NP-C  guaiFENesin-codeine 100-10 MG/5ML syrup Take 5 mLs by mouth 3 (three) times daily as needed for cough. 05/26/17   Hedges, Tinnie Gens, PA-C  lidocaine (XYLOCAINE) 2 % solution Use as directed 15 mLs as needed in the mouth or throat for mouth pain. Patient not taking: Reported on 12/08/2016 11/27/16   Maczis, Elmer Sow, PA-C    Family History Family History  Problem Relation Age of Onset  . Hypertension Mother   . Cancer Mother        uterine  . Other Father        unknown  . Cancer Sister        cervical  . Hypertension Brother   . Hyperlipidemia Brother   . Diabetes Brother   . Cancer Maternal Aunt        breast  . Hypertension Other   . Diabetes Other   . Stroke Maternal Grandfather   . Heart disease Maternal Grandfather   . Depression Sister  Social History Social History   Tobacco Use  . Smoking status: Current Every Day Smoker    Packs/day: 0.50    Years: 15.00    Pack years: 7.50    Types: Cigarettes  . Smokeless tobacco: Never Used  Substance Use Topics  . Alcohol use: Yes    Comment: occasional  . Drug use: No     Allergies   Patient has no known allergies.   Review of Systems Review of Systems  Constitutional: Negative for chills and fever.  HENT: Negative.   Respiratory: Negative.  Negative for shortness of breath.   Cardiovascular: Negative.  Negative for chest pain.  Gastrointestinal: Positive for abdominal pain and nausea. Vomiting: See HPI.  Genitourinary: Positive for  vaginal bleeding. Negative for dysuria, flank pain, frequency and vaginal discharge.  Musculoskeletal: Positive for back pain.  Skin: Negative.   Neurological: Negative.  Negative for weakness and light-headedness.     Physical Exam Updated Vital Signs BP 115/78 (BP Location: Left Arm)   Pulse 89   Temp 98.8 F (37.1 C) (Oral)   Resp 16   Ht 5\' 8"  (1.727 m)   Wt 94.7 kg   SpO2 100%   BMI 31.74 kg/m   Physical Exam Constitutional:      Appearance: She is well-developed.  HENT:     Head: Normocephalic.  Neck:     Musculoskeletal: Normal range of motion and neck supple.  Cardiovascular:     Rate and Rhythm: Normal rate.  Pulmonary:     Effort: Pulmonary effort is normal.  Abdominal:     General: Abdomen is flat. Bowel sounds are normal. There is no distension.     Palpations: Abdomen is soft.     Tenderness: There is abdominal tenderness in the suprapubic area and left lower quadrant. There is no guarding or rebound.  Genitourinary:    Vagina: No vaginal discharge.     Cervix: No cervical motion tenderness, discharge or friability.     Adnexa: Right adnexa normal and left adnexa normal.       Right: No tenderness.         Left: No tenderness.       Comments: Tenderness to left/center uterus. No adnexal tenderness. Minimal cervical bleeding.  Musculoskeletal: Normal range of motion.  Skin:    General: Skin is warm and dry.     Findings: No rash.  Neurological:     Mental Status: She is alert.     Cranial Nerves: No cranial nerve deficit.      ED Treatments / Results  Labs (all labs ordered are listed, but only abnormal results are displayed) Labs Reviewed  COMPREHENSIVE METABOLIC PANEL - Abnormal; Notable for the following components:      Result Value   Glucose, Bld 120 (*)    Calcium 8.7 (*)    All other components within normal limits  CBC - Abnormal; Notable for the following components:   WBC 11.2 (*)    All other components within normal limits    LIPASE, BLOOD  URINALYSIS, ROUTINE W REFLEX MICROSCOPIC  I-STAT BETA HCG BLOOD, ED ([facility], [facility], [facility] ONLY)    EKG None  Radiology No results found.  Procedures Procedures (including critical care time)  Medications Ordered in ED Medications - No data to display   Initial Impression / Assessment and Plan / ED Course  I have reviewed the triage vital signs and the nursing notes.  Pertinent labs & imaging results that were available during my  care of the patient were reviewed by me and considered in my medical decision making (see chart for details).     Patient to ED with lower abdominal discomfort for the past 4 days with onset expected menses last night, normal flow. History of fibroids, treated 6 years ago with "hormones" without further problem.   No fever. She states she came in for pain that is worse than her usual dysmenorrhea, no better with ibuprofen.   On exam, there is no adnexal tenderness to suggest torsion, TOA, PID. She has mild left uterine tenderness without discernable mass. She is not pregnant. Minimal cervical bleeding. No CMT, discharge. Negative Wet Prep.   VSS, afebrile. No evidence of infection, anemia.  Feel she can be discharged home and is encouraged to see her GYN in follow up. Pain managed in ED with single dose Norco. Will given Rx Naproxen for home.    Final Clinical Impressions(s) / ED Diagnoses   Final diagnoses:  None   1. Pelvic pain 2. Menorrhagia 3. History of fibroids  ED Discharge Orders    None       Elpidio AnisUpstill, Makailyn Mccormick, Cordelia Poche-C 01/13/18 2347    Margarita Grizzleay, Danielle, MD 01/14/18 [phone]

## 2018-01-13 NOTE — ED Triage Notes (Signed)
Pt reports lower L abdominal pain that started on Monday. She also states that she is on her period this week. Pt endorses heavy bleeding with clots. She states that she has had a fibroid tumor in the past that was treated with hormones. Family hx of same. Endorses nausea without vomiting today.

## 2018-01-15 LAB — GC/CHLAMYDIA PROBE AMP (~~LOC~~) NOT AT ARMC
Chlamydia: NEGATIVE
Neisseria Gonorrhea: NEGATIVE

## 2018-11-05 ENCOUNTER — Other Ambulatory Visit: Payer: Self-pay

## 2018-11-05 ENCOUNTER — Telehealth: Payer: Self-pay

## 2018-11-05 NOTE — Telephone Encounter (Signed)
LVM for pt advising we need to schedule an appt and also need info for thn gaps in care Last PAP and OBGYN doctor. [place]

## 2018-12-14 ENCOUNTER — Encounter: Payer: Self-pay | Admitting: Medical

## 2018-12-14 ENCOUNTER — Other Ambulatory Visit: Payer: Self-pay

## 2018-12-14 ENCOUNTER — Ambulatory Visit (INDEPENDENT_AMBULATORY_CARE_PROVIDER_SITE_OTHER): Payer: 59 | Admitting: Medical

## 2018-12-14 VITALS — BP 110/80 | HR 79 | Temp 98.4°F | Ht 69.0 in | Wt 206.0 lb

## 2018-12-14 DIAGNOSIS — R0602 Shortness of breath: Secondary | ICD-10-CM | POA: Diagnosis not present

## 2018-12-14 DIAGNOSIS — Z2821 Immunization not carried out because of patient refusal: Secondary | ICD-10-CM | POA: Diagnosis not present

## 2018-12-14 DIAGNOSIS — Z0000 Encounter for general adult medical examination without abnormal findings: Secondary | ICD-10-CM | POA: Diagnosis not present

## 2018-12-14 DIAGNOSIS — Z113 Encounter for screening for infections with a predominantly sexual mode of transmission: Secondary | ICD-10-CM | POA: Diagnosis not present

## 2018-12-14 DIAGNOSIS — F17200 Nicotine dependence, unspecified, uncomplicated: Secondary | ICD-10-CM | POA: Diagnosis not present

## 2018-12-14 DIAGNOSIS — Z13220 Encounter for screening for lipoid disorders: Secondary | ICD-10-CM

## 2018-12-14 MED ORDER — BUPROPION HCL ER (XL) 150 MG PO TB24: 150.0000 mg | ORAL_TABLET | Freq: Every day | ORAL | 1 refills | Status: DC

## 2018-12-14 NOTE — Progress Notes (Signed)
Subjective:   HPI  Patricia Hayes is a 33 y.o. female who presents for Chief Complaint  Patient presents with  . Annual Exam    with fasting labs-period is on     Patient Care Team: Tysinger, Camelia Eng, PA-C as PCP - General (Family Medicine) Sees dentist Sees eye doctor  Concerns: Last visit 2018  Wants help to stop tobacco.  Been a smoker 16+ years.  Wants medication to help quit.  No prior medication to help quit.   Been a little SOB with strenuous activity.   Attributes to smoker, weight gain with covid, being out of shape   Reviewed their medical, surgical, family, social, medication, and allergy history and updated chart as appropriate.  Past Medical History:  Diagnosis Date  . Anxiety    in past  . Depression    age 46yo  . Eczema   . GERD (gastroesophageal reflux disease)   . Hiatal hernia   . History of urinary tract infection   . Migraine   . Smoker   . Uterine cyst   . Wears contact lenses   . Wears glasses     Past Surgical History:  Procedure Laterality Date  . ESOPHAGOGASTRODUODENOSCOPY  2014   hiatal hernia; Dr. Benson [place]  . TUBAL LIGATION      Social History   Socioeconomic History  . Marital status: Single    Spouse name: Not on file  . Number of children: Not on file  . Years of education: Not on file  . Highest education level: Not on file  Occupational History  . Not on file  Social Needs  . Financial resource strain: Not on file  . Food insecurity    Worry: Not on file    Inability: Not on file  . Transportation needs    Medical: Not on file    Non-medical: Not on file  Tobacco Use  . Smoking status: Current Every Day Smoker    Packs/day: 0.50    Years: 16.00    Pack years: 8.00    Types: Cigarettes  . Smokeless tobacco: Never Used  Substance and Sexual Activity  . Alcohol use: Yes    Comment: occasional  . Drug use: No  . Sexual activity: Yes    Birth control/protection: Surgical  Lifestyle  . Physical activity   Days per week: Not on file    Minutes per session: Not on file  . Stress: Not on file  Relationships  . Social Herbalist on phone: Not on file    Gets together: Not on file    Attends religious service: Not on file    Active member of club or organization: Not on file    Attends meetings of clubs or organizations: Not on file    Relationship status: Not on file  . Intimate partner violence    Fear of current or ex partner: Not on file    Emotionally abused: Not on file    Physically abused: Not on file    Forced sexual activity: Not on file  Other Topics Concern  . Not on file  Social History Narrative   Lives at home with husband and her 3 kids.  Works at [employer] in [place], working from home.   Exercise - not much.  12/2018    Family History  Problem Relation Age of Onset  . Hypertension Mother   . Cancer Mother        uterine  . Other  Father        unknown  . Hypertension Brother   . Hyperlipidemia Brother   . Diabetes Brother   . Cancer Maternal Aunt        breast  . Hyperlipidemia Brother   . Hypertension Brother   . Hypertension Other   . Diabetes Other   . Stroke Maternal Grandfather   . Heart disease Maternal Grandfather   . Depression Sister      Current Outpatient Medications:  .  aspirin-acetaminophen-caffeine (EXCEDRIN MIGRAINE) 250-250-65 MG tablet, Take 1 tablet by mouth every 6 (six) hours as needed for headache. Reported on 07/20/2015, Disp: , Rfl:  .  amoxicillin-clavulanate (AUGMENTIN) 875-125 MG tablet, Take 1 tablet every 12 (twelve) hours by mouth. (Patient not taking: Reported on 12/14/2018), Disp: 14 tablet, Rfl: 0 .  buPROPion (WELLBUTRIN XL) 150 MG 24 hr tablet, Take 1 tablet (150 mg total) by mouth daily., Disp: 30 tablet, Rfl: 1 .  clotrimazole (MYCELEX) 10 MG troche, Take 1 tablet (10 mg total) by mouth 5 (five) times daily. (Patient not taking: Reported on 12/14/2018), Disp: 35 tablet, Rfl: 0 .  Crisaborole (EUCRISA) 2 % OINT,  Apply 1 application topically 2 (two) times daily. (Patient not taking: Reported on 11/27/2016), Disp: 10 g, Rfl: 0 .  guaiFENesin-codeine 100-10 MG/5ML syrup, Take 5 mLs by mouth 3 (three) times daily as needed for cough. (Patient not taking: Reported on 12/14/2018), Disp: 120 mL, Rfl: 0 .  lidocaine (XYLOCAINE) 2 % solution, Use as directed 15 mLs as needed in the mouth or throat for mouth pain. (Patient not taking: Reported on 12/08/2016), Disp: 100 mL, Rfl: 0 .  naproxen (NAPROSYN) 500 MG tablet, Take 1 tablet (500 mg total) by mouth 2 (two) times daily. (Patient not taking: Reported on 12/14/2018), Disp: 30 tablet, Rfl: 0 .  triamcinolone cream (KENALOG) 0.1 %, triamcinolone acetonide 0.1 % topical cream  Apply to affected areas bid x 1 week, off 1 week.  Avoid face and genital area., Disp: , Rfl:   No Known Allergies    Review of Systems Constitutional: -fever, -chills, -sweats, -unexpected weight change, -decreased appetite, -fatigue Allergy: -sneezing, -itching, -congestion Dermatology: -changing moles, --rash, -lumps ENT: -runny nose, -ear pain, -sore throat, -hoarseness, -sinus pain, -teeth pain, - ringing in ears, -hearing loss, -nosebleeds Cardiology: -chest pain, -palpitations, -swelling, -difficulty breathing when lying flat, -waking up short of breath Respiratory: -cough, +shortness of breath, -difficulty breathing with exercise or exertion, -wheezing, -coughing up blood Gastroenterology: -abdominal pain, -nausea, -vomiting, -diarrhea, -constipation, -blood in stool, -changes in bowel movement, -difficulty swallowing or eating Hematology: -bleeding, -bruising  Musculoskeletal: -joint aches, -muscle aches, -joint swelling, -back pain, -neck pain, -cramping, -changes in gait Ophthalmology: denies vision changes, eye redness, itching, discharge Urology: -burning with urination, -difficulty urinating, -blood in urine, -urinary frequency, -urgency, -incontinence Neurology: -headache,  -weakness, -tingling, -numbness, -memory loss, -falls, -dizziness Psychology: -depressed mood, -agitation, -sleep problems Breast/gyn: -breast tenderness, -discharge, -lumps, -vaginal discharge,- irregular periods, -heavy periods     Objective:  BP 110/80   Pulse 79   Temp 98.4 F (36.9 C)   Ht 5\' 9"  (1.753 m)   Wt 206 lb (93.4 kg)   SpO2 98%   BMI 30.42 kg/m     General appearance: alert, no distress, WD/WN, African American female Skin: tattoo of female face left lateral shoulder, tattoos right breast , right upper arm, no worrisome macules HEENT: normocephalic, conjunctiva/corneas normal, sclerae anicteric, PERRLA, EOMi, nares patent, no discharge or erythema, pharynx normal Oral  cavity: MMM, tongue normal, teeth normal Neck: supple, no lymphadenopathy, no thyromegaly, no masses, normal ROM, no bruits Chest: non tender, normal shape and expansion Heart: RRR, normal S1, S2, no murmurs Lungs: CTA bilaterally, no wheezes, rhonchi, or rales Abdomen: +bs, soft, non tender, non distended, no masses, no hepatomegaly, no splenomegaly, no bruits Back: non tender, normal ROM, no scoliosis Musculoskeletal: upper extremities non tender, no obvious deformity, normal ROM throughout, lower extremities non tender, no obvious deformity, normal ROM throughout Extremities: no edema, no cyanosis, no clubbing Pulses: 2+ symmetric, upper and lower extremities, normal cap refill Neurological: alert, oriented x 3, CN2-12 intact, strength normal upper extremities and lower extremities, sensation normal throughout, DTRs 2+ throughout, no cerebellar signs, gait normal Psychiatric: normal affect, behavior normal, pleasant  Breast/gyn/rectal - deferred due to starting menstrual cycle today  EKG Indication physical, sob, smoker Rate 58 bpm PR QRS 88 ms QTC Axis 39 degrees Sinus bradycardia, otherwise no acute changes   Assessment and Plan :   Encounter Diagnoses  Name Primary?  .  Encounter for health maintenance examination in adult Yes  . Tobacco use disorder   . Screen for STD (sexually transmitted disease)   . Influenza vaccination declined   . Screening for lipid disorders   . SOB (shortness of breath)     Physical exam - discussed and counseled on healthy lifestyle, diet, exercise, preventative care, vaccinations, sick and well care, proper use of emergency dept and after hours care, and addressed their concerns.    Health screening: Advised they see their eye doctor yearly for routine vision care. Advised they see their dentist yearly for routine dental care including hygiene visits twice yearly.  Cancer screening Counseled on self breast exams, mammograms, cervical cancer screening  She will return for pap.  She is due but on menstrual period today   Vaccinations: Declines flu shot  She is up to date on Td vaccine   Separate significant issues discussed: Tobacco use - begin Wellbutrin, advised counseling to stop tobacco, discussed risks/benefits of medication.  F/u 3-4 wk  Obesity - counseled on diet, exercise, and efforts to lose weight  SOB - likely due to combination of tobacco use, weight gain, deconditioning.   Shelsie was seen today for annual exam.  Diagnoses and all orders for this visit:  Encounter for health maintenance examination in adult -     CBC with Differential -     Lipid Panel -     Hemoglobin A1c -     Comprehensive metabolic panel -     HIV regular -     RPR regular -     GC/Chlamydia Probe Amp -     EKG 12-Lead  Tobacco use disorder  Screen for STD (sexually transmitted disease) -     HIV regular -     RPR regular -     GC/Chlamydia Probe Amp  Influenza vaccination declined  Screening for lipid disorders -     Lipid Panel  SOB (shortness of breath) -     EKG 12-Lead  Other orders -     buPROPion (WELLBUTRIN XL) 150 MG 24 hr tablet; Take 1 tablet (150 mg total) by mouth daily.   Follow-up pending  labs, yearly for physical

## 2018-12-15 ENCOUNTER — Other Ambulatory Visit: Payer: Self-pay | Admitting: Medical

## 2018-12-15 LAB — COMPREHENSIVE METABOLIC PANEL
ALT: 9 IU/L (ref 0–32)
AST: 13 IU/L (ref 0–40)
Albumin/Globulin Ratio: 1.5 (ref 1.2–2.2)
Albumin: 4 g/dL (ref 3.8–4.8)
Alkaline Phosphatase: 75 IU/L (ref 39–117)
BUN/Creatinine Ratio: 9 (ref 9–23)
BUN: 6 mg/dL (ref 6–20)
Bilirubin Total: <0.2 mg/dL (ref 0.0–1.2)
CO2: 26 mmol/L (ref 20–29)
Calcium: 9.1 mg/dL (ref 8.7–10.2)
Chloride: 105 mmol/L (ref 96–106)
Creatinine, Ser: 0.66 mg/dL (ref 0.57–1.00)
GFR calc Af Amer: 132 mL/min/{1.73_m2} (ref >59)
GFR calc non Af Amer: 115 mL/min/{1.73_m2} (ref >59)
Globulin, Total: 2.7 g/dL (ref 1.5–4.5)
Glucose: 82 mg/dL (ref 65–99)
Potassium: 4.3 mmol/L (ref 3.5–5.2)
Sodium: 142 mmol/L (ref 134–144)
Total Protein: 6.7 g/dL (ref 6.0–8.5)

## 2018-12-15 LAB — RPR: RPR Ser Ql: NONREACTIVE

## 2018-12-15 LAB — HIV ANTIBODY (ROUTINE TESTING W REFLEX): HIV Screen 4th Generation wRfx: NONREACTIVE

## 2018-12-15 LAB — CBC WITH DIFFERENTIAL/PLATELET
Basophils Absolute: 0.1 10*3/uL (ref 0.0–0.2)
Basos: 1 %
EOS (ABSOLUTE): 0.1 10*3/uL (ref 0.0–0.4)
Eos: 1 %
Hematocrit: 37.8 % (ref 34.0–46.6)
Hemoglobin: 12.4 g/dL (ref 11.1–15.9)
Immature Grans (Abs): 0 10*3/uL (ref 0.0–0.1)
Immature Granulocytes: 0 %
Lymphocytes Absolute: 2.4 10*3/uL (ref 0.7–3.1)
Lymphs: 31 %
MCH: 28.1 pg (ref 26.6–33.0)
MCHC: 32.8 g/dL (ref 31.5–35.7)
MCV: 86 fL (ref 79–97)
Monocytes Absolute: 0.7 10*3/uL (ref 0.1–0.9)
Monocytes: 9 %
Neutrophils Absolute: 4.5 10*3/uL (ref 1.4–7.0)
Neutrophils: 58 %
Platelets: 320 10*3/uL (ref 150–450)
RBC: 4.42 x10E6/uL (ref 3.77–5.28)
RDW: 13.4 % (ref 11.7–15.4)
WBC: 7.8 10*3/uL (ref 3.4–10.8)

## 2018-12-15 LAB — LIPID PANEL
Chol/HDL Ratio: 2.8 ratio (ref 0.0–4.4)
Cholesterol, Total: 135 mg/dL (ref 100–199)
HDL: 49 mg/dL (ref >39)
LDL Chol Calc (NIH): 68 mg/dL (ref 0–99)
Triglycerides: 95 mg/dL (ref 0–149)
VLDL Cholesterol Cal: 18 mg/dL (ref 5–40)

## 2018-12-15 LAB — HEMOGLOBIN A1C
Est. average glucose Bld gHb Est-mCnc: 111 mg/dL
Hgb A1c MFr Bld: 5.5 % (ref 4.8–5.6)

## 2018-12-15 LAB — GC/CHLAMYDIA PROBE AMP
Chlamydia trachomatis, NAA: NEGATIVE
Neisseria Gonorrhoeae by PCR: NEGATIVE

## 2018-12-29 ENCOUNTER — Other Ambulatory Visit: Payer: 59 | Admitting: Medical

## 2019-03-07 ENCOUNTER — Ambulatory Visit (HOSPITAL_COMMUNITY)
Admission: EM | Admit: 2019-03-07 | Discharge: 2019-03-07 | Disposition: A | Discharge status: Home / Self Care | Payer: 59 | Attending: Physician Assistant | Admitting: Physician Assistant

## 2019-03-07 ENCOUNTER — Ambulatory Visit (INDEPENDENT_AMBULATORY_CARE_PROVIDER_SITE_OTHER)
Admission: RE | Admit: 2019-03-07 | Discharge: 2019-03-07 | Disposition: A | Discharge status: Home / Self Care | Payer: 59 | Source: Ambulatory Visit

## 2019-03-07 ENCOUNTER — Encounter (HOSPITAL_COMMUNITY): Payer: Self-pay

## 2019-03-07 ENCOUNTER — Other Ambulatory Visit: Payer: Self-pay

## 2019-03-07 DIAGNOSIS — H6690 Otitis media, unspecified, unspecified ear: Secondary | ICD-10-CM | POA: Diagnosis not present

## 2019-03-07 DIAGNOSIS — J069 Acute upper respiratory infection, unspecified: Secondary | ICD-10-CM | POA: Diagnosis not present

## 2019-03-07 DIAGNOSIS — H9202 Otalgia, left ear: Secondary | ICD-10-CM

## 2019-03-07 MED ORDER — AMOXICILLIN 500 MG PO CAPS: 500.0000 mg | ORAL_CAPSULE | Freq: Three times a day (TID) | ORAL | 0 refills | Status: DC

## 2019-03-07 NOTE — Discharge Instructions (Addendum)
Take over-the-counter plain Mucinex and ibuprofen as needed for your symptoms.    Follow-up with your primary care provider or come to the urgent care to be seen in person if your symptoms are not improving.

## 2019-03-07 NOTE — ED Provider Notes (Signed)
[facility]    CSN: [id number] Arrival date & time: 03/07/19  1927      History   Chief Complaint Chief Complaint  Patient presents with  . Otalgia    left     HPI Patricia Hayes is a 34 y.o. female.   The history is provided by the patient. No language interpreter was used.  Otalgia Location:  Left Behind ear:  No abnormality Quality:  Aching Severity:  Mild Onset quality:  Gradual Timing:  Constant Progression:  Worsening Chronicity:  New Context: elevation change   Relieved by:  Nothing Worsened by:  Nothing Ineffective treatments:  None tried   Past Medical History:  Diagnosis Date  . Anxiety    in past  . Depression    age 39yo  . Eczema   . GERD (gastroesophageal reflux disease)   . Hiatal hernia   . History of urinary tract infection   . Migraine   . Smoker   . Uterine cyst   . Wears contact lenses   . Wears glasses     Patient Active Problem List   Diagnosis Date Noted  . Screening for lipid disorders 12/14/2018  . Tobacco use disorder 12/14/2018  . Influenza vaccination declined 12/14/2018  . SOB (shortness of breath) 12/14/2018  . Eczema 01/17/2014  . Irregular periods 01/17/2014  . Depression 01/17/2014  . Chronic nausea 01/17/2014  . Other migraine without status migrainosus, not intractable 01/17/2014    Past Surgical History:  Procedure Laterality Date  . ESOPHAGOGASTRODUODENOSCOPY  2014   hiatal hernia; Dr. Elnoria Howard  . TUBAL LIGATION      OB History   No obstetric history on file.      Home Medications    Prior to Admission medications   Medication Sig Start Date End Date Taking? Authorizing Provider  amoxicillin (AMOXIL) 500 MG capsule Take 1 capsule (500 mg total) by mouth 3 (three) times daily. 03/07/19   Elson Areas, PA-C  buPROPion (WELLBUTRIN XL) 150 MG 24 hr tablet Take 1 tablet (150 mg total) by mouth daily. 12/14/18   Tysinger, Kermit Balo, PA-C    Family History Family History  Problem Relation Age  of Onset  . Hypertension Mother   . Cancer Mother        uterine  . Other Father        unknown  . Hypertension Brother   . Hyperlipidemia Brother   . Diabetes Brother   . Cancer Maternal Aunt        breast  . Hyperlipidemia Brother   . Hypertension Brother   . Hypertension Other   . Diabetes Other   . Stroke Maternal Grandfather   . Heart disease Maternal Grandfather   . Depression Sister     Social History Social History   Tobacco Use  . Smoking status: Current Every Day Smoker    Packs/day: 0.50    Years: 16.00    Pack years: 8.00    Types: Cigarettes  . Smokeless tobacco: Never Used  Substance Use Topics  . Alcohol use: Yes    Comment: occasional  . Drug use: No     Allergies   Patient has no known allergies.   Review of Systems Review of Systems  HENT: Positive for ear pain.   All other systems reviewed and are negative.    Physical Exam Triage Vital Signs ED Triage Vitals  Enc Vitals Group     BP 03/07/19 2004 (!) 127/104  Pulse Rate 03/07/19 2004 95     Resp 03/07/19 2004 18     Temp 03/07/19 2004 98.8 F (37.1 C)     Temp Source 03/07/19 2004 Oral     SpO2 03/07/19 2004 100 %     Weight --      Height --      Head Circumference --      Peak Flow --      Pain Score 03/07/19 2001 0     Pain Loc --      Pain Edu? --      Excl. in [place]? --    No data found.  Updated Vital Signs BP (!) 127/104 (BP Location: Left Arm)   Pulse 95   Temp 98.8 F (37.1 C) (Oral)   Resp 18   SpO2 100%   Visual Acuity Right Eye Distance:   Left Eye Distance:   Bilateral Distance:    Right Eye Near:   Left Eye Near:    Bilateral Near:     Physical Exam Vitals and nursing note reviewed.  Constitutional:      Appearance: She is well-developed.  HENT:     Head: Normocephalic.     Comments: Left tm erythematous bulging    Right Ear: Tympanic membrane normal.     Nose: Nose normal.     Mouth/Throat:     Mouth: Mucous membranes are moist.    Cardiovascular:     Rate and Rhythm: Normal rate.  Pulmonary:     Effort: Pulmonary effort is normal.  Abdominal:     General: There is no distension.  Musculoskeletal:        General: Normal range of motion.     Cervical back: Normal range of motion.  Skin:    General: Skin is warm.  Neurological:     General: No focal deficit present.     Mental Status: She is alert and oriented to person, place, and time.  Psychiatric:        Mood and Affect: Mood normal.      UC Treatments / Results  Labs (all labs ordered are listed, but only abnormal results are displayed) Labs Reviewed - No data to display  EKG   Radiology No results found.  Procedures Procedures (including critical care time)  Medications Ordered in UC Medications - No data to display  Initial Impression / Assessment and Plan / UC Course  I have reviewed the triage vital signs and the nursing notes.  Pertinent labs & imaging results that were available during my care of the patient were reviewed by me and considered in my medical decision making (see chart for details).     MDM: Pt has had a cough for several weeks,  Possible sinus issues along with ear Final Clinical Impressions(s) / UC Diagnoses   Final diagnoses:  Acute otitis media, unspecified otitis media type     Discharge Instructions     Return if any problems.    ED Prescriptions    Medication Sig Dispense Auth. Provider   amoxicillin (AMOXIL) 500 MG capsule Take 1 capsule (500 mg total) by mouth 3 (three) times daily. 30 capsule Fransico Meadow, [place]     PDMP not reviewed this encounter.  An After Visit Summary was printed and given to the patient.   Fransico Meadow, [place] 03/07/19 2018

## 2019-03-07 NOTE — ED Provider Notes (Signed)
Virtual Visit via Video Note:  Patricia Hayes  initiated request for Telemedicine visit with [facility] team. I connected with Joycelyn Man  on 03/07/2019 at 3:44 PM  for a synchronized telemedicine visit using a video enabled HIPPA compliant telemedicine application. I verified that I am speaking with Joycelyn Man  using two identifiers. Sharion Balloon, NP  was physically located in a [facility] Urgent care site and Ioma Chismar was located at a different location.   The limitations of evaluation and management by telemedicine as well as the availability of in-person appointments were discussed. Patient was informed that she  may incur a bill ( including co-pay) for this virtual visit encounter. Baleigh Rennaker  expressed understanding and gave verbal consent to proceed with virtual visit.     History of Present Illness:Patricia Hayes  is a 34 y.o. female presents for evaluation of nonproductive cough and rhinorrhea since December 2020.  She also reports 1 week history of left ear pain and feels "clogged up".  She denies fever, chills, sore throat, shortness of breath,  vomiting, diarrhea, rash, or other symptoms.  She has been treating her symptoms at home with Nyquil and elderberry syrup.     No Known Allergies   Past Medical History:  Diagnosis Date  . Anxiety    in past  . Depression    age 59yo  . Eczema   . GERD (gastroesophageal reflux disease)   . Hiatal hernia   . History of urinary tract infection   . Migraine   . Smoker   . Uterine cyst   . Wears contact lenses   . Wears glasses      Social History   Tobacco Use  . Smoking status: Current Every Day Smoker    Packs/day: 0.50    Years: 16.00    Pack years: 8.00    Types: Cigarettes  . Smokeless tobacco: Never Used  Substance Use Topics  . Alcohol use: Yes    Comment: occasional  . Drug use: No    ROS: as stated in HPI.  All other systems reviewed and negative.       Observations/Objective: Physical Exam  VITALS: Patient denies fever. GENERAL: Alert, appears well and in no acute distress. HEENT: Atraumatic. NECK: Normal movements of the head and neck. CARDIOPULMONARY: No increased WOB. Speaking in clear sentences. I:E ratio WNL.  MS: Moves all visible extremities without noticeable abnormality. PSYCH: Pleasant and cooperative, well-groomed. Speech normal rate and rhythm. Affect is appropriate. Insight and judgement are appropriate. Attention is focused, linear, and appropriate.  NEURO: CN grossly intact. Oriented as arrived to appointment on time with no prompting. Moves both UE equally.  SKIN: No obvious lesions, wounds, erythema, or cyanosis noted on face or hands.   Assessment and Plan:    ICD-10-CM   1. Otalgia of left ear  H92.02   2. Upper respiratory tract infection, unspecified type  J06.9        Follow Up Instructions: Discussed with patient that she could attempt treatment at home with ibuprofen and plain over-the-counter Mucinex.  Discussed that, alternatively, she can be seen in person by her PCP or here at the urgent care to have her ear examined.  Patient agrees to plan of care.      I discussed the assessment and treatment plan with the patient. The patient was provided an opportunity to ask questions and all were answered. The patient agreed with the plan and demonstrated an understanding of the  instructions.   The patient was advised to call back or seek an in-person evaluation if the symptoms worsen or if the condition fails to improve as anticipated.      Mickie Bail, NP  03/07/2019 3:44 PM         Mickie Bail, NP 03/07/19 1544

## 2019-03-07 NOTE — Discharge Instructions (Signed)
Return if any problems.

## 2019-03-07 NOTE — ED Triage Notes (Signed)
Pt present left ear pain with a cough. Symptom started two weeks ago.

## 2021-03-22 ENCOUNTER — Encounter: Payer: Self-pay | Admitting: Emergency Medicine

## 2021-03-22 ENCOUNTER — Emergency Department: Payer: No Typology Code available for payment source

## 2021-03-22 ENCOUNTER — Other Ambulatory Visit: Payer: Self-pay

## 2021-03-22 ENCOUNTER — Emergency Department
Admission: EM | Admit: 2021-03-22 | Discharge: 2021-03-22 | Disposition: A | Discharge status: Home / Self Care | Payer: No Typology Code available for payment source | Attending: Emergency Medicine | Admitting: Emergency Medicine

## 2021-03-22 DIAGNOSIS — N939 Abnormal uterine and vaginal bleeding, unspecified: Secondary | ICD-10-CM

## 2021-03-22 DIAGNOSIS — R1030 Lower abdominal pain, unspecified: Secondary | ICD-10-CM | POA: Insufficient documentation

## 2021-03-22 DIAGNOSIS — N938 Other specified abnormal uterine and vaginal bleeding: Secondary | ICD-10-CM | POA: Diagnosis present

## 2021-03-22 LAB — BASIC METABOLIC PANEL
Anion gap: 3 — ABNORMAL LOW (ref 5–15)
BUN: 9 mg/dL (ref 6–20)
CO2: 28 mmol/L (ref 22–32)
Calcium: 8.5 mg/dL — ABNORMAL LOW (ref 8.9–10.3)
Chloride: 107 mmol/L (ref 98–111)
Creatinine, Ser: 0.58 mg/dL (ref 0.44–1.00)
GFR, Estimated: >60 mL/min (ref >60)
Glucose, Bld: 104 mg/dL — ABNORMAL HIGH (ref 70–99)
Potassium: 3.5 mmol/L (ref 3.5–5.1)
Sodium: 138 mmol/L (ref 135–145)

## 2021-03-22 LAB — TYPE AND SCREEN
ABO/RH(D): B POS
Antibody Screen: NEGATIVE

## 2021-03-22 LAB — URINALYSIS, ROUTINE W REFLEX MICROSCOPIC
Bacteria, UA: NONE SEEN
Bilirubin Urine: NEGATIVE
Glucose, UA: NEGATIVE mg/dL
Ketones, ur: 5 mg/dL — AB
Leukocytes,Ua: NEGATIVE
Nitrite: NEGATIVE
Protein, ur: NEGATIVE mg/dL
RBC / HPF: >50 RBC/hpf — ABNORMAL HIGH (ref 0–5)
Specific Gravity, Urine: 1.027 (ref 1.005–1.030)
pH: 5 (ref 5.0–8.0)

## 2021-03-22 LAB — CBC WITH DIFFERENTIAL/PLATELET
Abs Immature Granulocytes: 0.02 10*3/uL (ref 0.00–0.07)
Basophils Absolute: 0.1 10*3/uL (ref 0.0–0.1)
Basophils Relative: 1 %
Eosinophils Absolute: 0.1 10*3/uL (ref 0.0–0.5)
Eosinophils Relative: 1 %
HCT: 29.1 % — ABNORMAL LOW (ref 36.0–46.0)
Hemoglobin: 8.8 g/dL — ABNORMAL LOW (ref 12.0–15.0)
Immature Granulocytes: 0 %
Lymphocytes Relative: 26 %
Lymphs Abs: 2.5 10*3/uL (ref 0.7–4.0)
MCH: 22.9 pg — ABNORMAL LOW (ref 26.0–34.0)
MCHC: 30.2 g/dL (ref 30.0–36.0)
MCV: 75.8 fL — ABNORMAL LOW (ref 80.0–100.0)
Monocytes Absolute: 0.8 10*3/uL (ref 0.1–1.0)
Monocytes Relative: 8 %
Neutro Abs: 6.1 10*3/uL (ref 1.7–7.7)
Neutrophils Relative %: 64 %
Platelets: 345 10*3/uL (ref 150–400)
RBC: 3.84 MIL/uL — ABNORMAL LOW (ref 3.87–5.11)
RDW: 15.9 % — ABNORMAL HIGH (ref 11.5–15.5)
WBC: 9.5 10*3/uL (ref 4.0–10.5)
nRBC: 0 % (ref 0.0–0.2)

## 2021-03-22 LAB — POC URINE PREG, ED: Preg Test, Ur: NEGATIVE

## 2021-03-22 MED ORDER — ONDANSETRON 4 MG PO TBDP
4.0000 mg | ORAL_TABLET | Freq: Once | ORAL | Status: AC
  Administered 2021-03-22: 4 mg via ORAL
  Filled 2021-03-22: qty 1

## 2021-03-22 MED ORDER — OXYCODONE-ACETAMINOPHEN 5-325 MG PO TABS
1.0000 | ORAL_TABLET | Freq: Once | ORAL | Status: AC
  Administered 2021-03-22: 1 via ORAL
  Filled 2021-03-22: qty 1

## 2021-03-22 MED ORDER — NORGESTIMATE-ETH ESTRADIOL 0.25-35 MG-MCG PO TABS: 1.0000 | ORAL_TABLET | Freq: Every day | ORAL | 11 refills | Status: DC

## 2021-03-22 NOTE — ED Provider Notes (Signed)
? ?[facility] ?Provider Note ? ? ? Event Date/Time  ? First MD Initiated Contact with Patient 03/22/21 1853   ?  (approximate) ? ? ?History  ? ?Vaginal Bleeding and Abdominal Cramping ? ? ?HPI ? ?Reiss Greenwalt is a 36 y.o. female status post tubal ligation many years ago who presents with lower abdominal crampy pain and vaginal bleeding since yesterday when her period started.  The patient states that she has had heavy and painful periods for the last 2 to 3 months, but this episode is worse.  She denies associated nausea or vomiting, dysuria, vaginal discharge, or other acute symptoms.  She denies dizziness or lightheadedness.  She denies other abnormal bleeding or bruising. ? ? ? ?Physical Exam  ? ?Triage Vital Signs: ?ED Triage Vitals  ?Enc Vitals Group  ?   BP 03/22/21 1859 (!) 130/94  ?   Pulse Rate 03/22/21 1859 82  ?   Resp 03/22/21 1859 16  ?   Temp 03/22/21 1859 97.9 ?F (36.6 ?C)  ?   Temp Source 03/22/21 1859 Oral  ?   SpO2 03/22/21 1859 100 %  ?   Weight 03/22/21 1840 207 lb 3.7 oz (94 kg)  ?   Height 03/22/21 1840 5\' 9"  (1.753 m)  ?   Head Circumference --   ?   Peak Flow --   ?   Pain Score 03/22/21 1840 10  ?   Pain Loc --   ?   Pain Edu? --   ?   Excl. in [place]? --   ? ? ?Most recent vital signs: ?Vitals:  ? 03/22/21 1859  ?BP: (!) 130/94  ?Pulse: 82  ?Resp: 16  ?Temp: 97.9 ?F (36.6 ?C)  ?SpO2: 100%  ? ? ? ?General: Awake, no distress.  ?CV:  Good peripheral perfusion.  ?Resp:  Normal effort.  ?Abd:  Abdomen soft and nontender.  No distention.  ?Other:  No CMT or adnexal tenderness on pelvic exam.  Mild to moderate amount of blood in the vault with no active hemorrhage. ? ? ?ED Results / Procedures / Treatments  ? ?Labs ?(all labs ordered are listed, but only abnormal results are displayed) ?Labs Reviewed  ?CBC WITH DIFFERENTIAL/PLATELET - Abnormal; Notable for the following components:  ?    Result Value  ? RBC 3.84 (*)   ? Hemoglobin 8.8 (*)   ? HCT 29.1 (*)   ? MCV 75.8 (*)    ? MCH 22.9 (*)   ? RDW 15.9 (*)   ? All other components within normal limits  ?BASIC METABOLIC PANEL  ?URINALYSIS, ROUTINE W REFLEX MICROSCOPIC  ?POC URINE PREG, ED  ?TYPE AND SCREEN  ? ? ? ?EKG ? ? ? ? ?RADIOLOGY ? ?US pelvis: Pending ? ? ?PROCEDURES: ? ?Critical Care performed: No ? ?Procedures ? ? ?MEDICATIONS ORDERED IN ED: ?Medications - No data to display ? ? ?IMPRESSION / MDM / ASSESSMENT AND PLAN / ED COURSE  ?I reviewed the triage vital signs and the nursing notes. ? ?36 year old female presents with a painful and heavy menstrual period over the last 1 to 2 days, with history of recent heavy and painful periods. ? ?On exam the patient is well-appearing and her vital signs are normal.  She took Tylenol and BC powder earlier today with moderate relief. ? ?Differential includes uterine fibroid, endometriosis, polyps, menorrhagia or other dysfunctional uterine bleeding.  I do not suspect pregnancy given that the patient is many years status post tubal ligation. ? ?  We will obtain lab work-up to rule out anemia, ultrasound, and reassess. ? ?----------------------------------------- ?7:39 PM on 03/22/2021 ?----------------------------------------- ? ?Lab work-up and ultrasound are pending.  I have signed the patient out to the oncoming APP Sherral Hammers and MD Cheri Fowler. ? ? ?FINAL CLINICAL IMPRESSION(S) / ED DIAGNOSES  ? ?Final diagnoses:  ?Abnormal vaginal bleeding  ? ? ? ?Rx / DC Orders  ? ?ED Discharge Orders   ? ? None  ? ?  ? ? ? ?Note:  This document was prepared using Dragon voice recognition software and may include unintentional dictation errors.  ?  Arta Silence, MD ?03/22/21 1939 ? ?

## 2021-03-22 NOTE — ED Notes (Signed)
See triage note. Pt to ED for cramping abdominal pain since 4 days and heavy vaginal bleeding since 2 days with silver dollar size clots. Denies dizziness. Denies pregnancy as had tubal ligation years ago.  ? ?States could barely walk this morning from pain, slept with heating pad on all night and has taken (6) 500mg  tylenol tabs today since 0630 this morning. ? ?EDP at bedside. ?

## 2021-03-22 NOTE — ED Triage Notes (Signed)
Pt reports started her menstrual cycle 2 days ago ad her cramps are worse than they have ever been and the flow is heavy. Pt reports does not thinks she is pregnant because she had her tubes tied.  ?

## 2021-03-22 NOTE — ED Provider Notes (Signed)
  Physical Exam  BP (!) 132/96   Pulse 84   Temp 97.9 F (36.6 C) (Oral)   Resp 16   Ht 5\' 9"  (1.753 m)   Wt 94 kg   LMP 03/21/2021 (Exact Date)   SpO2 100%   BMI 30.60 kg/m   Physical Exam  Procedures  Procedures  ED Course / MDM    Medical Decision Making Amount and/or Complexity of Data Reviewed Labs: ordered. Radiology: ordered.  Risk Prescription drug management.   Assumed patient care from Dionne Bucy MD.  Pelvic ultrasound indicated endometrial thickening with heterogenous echotexture and scattered areas of vascularity largely consistent with adenomyosis.  Patient's hemoglobin currently 8.8 which has down trended from hemoglobin reviewed from 2 years ago.  We will start patient on Sprintec until she can be evaluated by gynecology.  A referral to gynecology was given.  Patient remained hemodynamically stable throughout emergency department course.  Return precautions were given to return with new or worsening symptoms.  All patient questions were answered.       Pia Mau [place], PA-C 03/22/21 2205    Merwyn Katos, MD 03/26/21 [phone]

## 2021-03-22 NOTE — Discharge Instructions (Addendum)
Take Sprintec as directed.   ?Please follow-up with OB/GYN. ?

## 2021-04-11 ENCOUNTER — Ambulatory Visit: Payer: No Typology Code available for payment source | Admitting: Nurse Practitioner

## 2021-04-17 ENCOUNTER — Ambulatory Visit (INDEPENDENT_AMBULATORY_CARE_PROVIDER_SITE_OTHER): Payer: No Typology Code available for payment source | Admitting: Medical

## 2021-04-17 ENCOUNTER — Encounter: Payer: Self-pay | Admitting: Medical

## 2021-04-17 VITALS — BP 122/72 | HR 101 | Ht 70.0 in | Wt 200.0 lb

## 2021-04-17 DIAGNOSIS — Z2821 Immunization not carried out because of patient refusal: Secondary | ICD-10-CM

## 2021-04-17 DIAGNOSIS — Z0000 Encounter for general adult medical examination without abnormal findings: Secondary | ICD-10-CM

## 2021-04-17 DIAGNOSIS — F17200 Nicotine dependence, unspecified, uncomplicated: Secondary | ICD-10-CM

## 2021-04-17 DIAGNOSIS — N920 Excessive and frequent menstruation with regular cycle: Secondary | ICD-10-CM

## 2021-04-17 DIAGNOSIS — Z13220 Encounter for screening for lipoid disorders: Secondary | ICD-10-CM

## 2021-04-17 DIAGNOSIS — D509 Iron deficiency anemia, unspecified: Secondary | ICD-10-CM | POA: Diagnosis not present

## 2021-04-17 DIAGNOSIS — R221 Localized swelling, mass and lump, neck: Secondary | ICD-10-CM

## 2021-04-17 NOTE — Patient Instructions (Signed)
?This visit was a preventative care visit, also known as wellness visit or routine physical.   Topics typically include healthy lifestyle, diet, exercise, preventative care, vaccinations, sick and well care, proper use of emergency dept and after hours care, as well as other concerns.   ? ? ?Recommendations: ?Continue to return yearly for your annual wellness and preventative care visits.  This gives [place] a chance to discuss healthy lifestyle, exercise, vaccinations, review your chart record, and perform screenings where appropriate. ? ?I recommend you see your eye doctor yearly for routine vision care. ? ?I recommend you see your dentist yearly for routine dental care including hygiene visits twice yearly. ? ? ?Vaccination recommendations were reviewed ?Immunization History  ?Administered Date(s) Administered  ? Influenza,inj,Quad PF,6+ Mos 01/17/2014  ? Pneumococcal Polysaccharide-23 01/17/2014  ? Tdap 01/17/2014  ? ? ? ?Screening for cancer: ?Colon cancer screening: ?Age 36 ? ?Breast cancer screening: ?You should perform a self breast exam monthly.   ?We reviewed recommendations for regular mammograms and breast cancer screening. ? ?Cervical cancer screening: ?We reviewed recommendations for pap smear screening.  Referral to gynecology ?  ?Skin cancer screening: ?Check your skin regularly for new changes, growing lesions, or other lesions of concern ?Come in for evaluation if you have skin lesions of concern. ? ?Lung cancer screening: ?If you have a greater than 20 pack year history of tobacco use, then you may qualify for lung cancer screening with a chest CT scan.   Please call your insurance company to inquire about coverage for this test. ? ?We currently don't have screenings for other cancers besides breast, cervical, colon, and lung cancers.  If you have a strong family history of cancer or have other cancer screening concerns, please let me know.  ? ? ?Bone health: ?Get at least 150 minutes of aerobic  exercise weekly ?Get weight bearing exercise at least once weekly ?Bone density test:  ?A bone density test is an imaging test that uses a type of X-ray to measure the amount of calcium and other minerals in your bones. ?The test may be used to diagnose or screen you for a condition that causes weak or thin bones (osteoporosis), predict your risk for a broken bone (fracture), or determine how well your osteoporosis treatment is working. ?The bone density test is recommended for females 65 and older, or females or males <65 if certain risk factors such as thyroid disease, long term use of steroids such as for asthma or rheumatological issues, vitamin D deficiency, estrogen deficiency, family history of osteoporosis, self or family history of fragility fracture in first degree relative. ? ? ? ?Heart health: ?Get at least 150 minutes of aerobic exercise weekly ?Limit alcohol ?It is important to maintain a healthy blood pressure and healthy cholesterol numbers ? ?Heart disease screening: ?Screening for heart disease includes screening for blood pressure, fasting lipids, glucose/diabetes screening, BMI height to weight ratio, reviewed of smoking status, physical activity, and diet.   ? ?Goals include blood pressure 120/80 or less, maintaining a healthy lipid/cholesterol profile, preventing diabetes or keeping diabetes numbers under good control, not smoking or using tobacco products, exercising most days per week or at least 150 minutes per week of exercise, and eating healthy variety of fruits and vegetables, healthy oils, and avoiding unhealthy food choices like fried food, fast food, high sugar and high cholesterol foods.   ? ?Other tests may possibly include EKG test, CT coronary calcium score, echocardiogram, exercise treadmill stress test.  ? ? ? ?Medical  care options: ?I recommend you continue to seek care here first for routine care.  We try really hard to have available appointments Monday through Friday daytime  hours for sick visits, acute visits, and physicals.  Urgent care should be used for after hours and weekends for significant issues that cannot wait till the next day.  The emergency department should be used for significant potentially life-threatening emergencies.  The emergency department is expensive, can often have long wait times for less significant concerns, so try to utilize primary care, urgent care, or telemedicine when possible to avoid unnecessary trips to the emergency department.  Virtual visits and telemedicine have been introduced since the pandemic started in 2020, and can be convenient ways to receive medical care.  We offer virtual appointments as well to assist you in a variety of options to seek medical care. ? ? ?Separate significant issues discussed: ? ?Tobacco use-i strongly recommend you quit smoking ? ?Neck lump-I will consult with radiology and refer you for imaging of the asymmetry ? ?Referral to gynecology urgently for anemia and heavy menstrual bleeding ? ?Iron deficiency anemia-continue iron.  Pending labs we may have to do an iron infusion ?

## 2021-04-17 NOTE — Progress Notes (Signed)
Subjective:  ? ?HPI ? Patricia Hayes is a 36 y.o. female who presents for ?Chief Complaint  ?Patient presents with  ? fasting cpe,   ?  Fasting cpe, would like pap but still bleeding since last month. Needs to see obgyn  ? ? ?Patient Care Team: ?Twan Harkin, Leward Quan as PCP - General (Family Medicine) ?Sees dentist ?Sees eye doctor ? ?Concerns: ?Here for physical.  I have not seen her in the over a year for a physical.  She ended up moving to [place], bought a house in [place] with her family. ? ?Beginning or march had heavy menstrual bleeding, heavy cramping a week prior.  Went to emergency dept for this in March.  They advised she had thick lining of uterus.  They advised OB/Gyn.   She scheduled gyn appt but its several months away.   ED started her on birth control to help with bleeding.  Recent labs showed anemia.  She has no prior iron infusion history.  No prior blood transfusion.  She did start taking iron a month ago.  Once a day iron is giving her some constipation.  Mother had a history of uterine cancer. ? ?She is a smoker ? ?Reviewed their medical, surgical, family, social, medication, and allergy history and updated chart as appropriate. ? ?Past Medical History:  ?Diagnosis Date  ? Anxiety   ? in past  ? Depression   ? age 58yo  ? Eczema   ? GERD (gastroesophageal reflux disease)   ? Hiatal hernia   ? History of urinary tract infection   ? Migraine   ? Smoker   ? Uterine cyst   ? Wears contact lenses   ? Wears glasses   ? ? ?Family History  ?Problem Relation Age of Onset  ? Hypertension Mother   ? Cancer Mother   ?     uterine  ? Other Father   ?     unknown  ? Depression Sister   ? Hypertension Brother   ? Hyperlipidemia Brother   ? Diabetes Brother   ? Hyperlipidemia Brother   ? Hypertension Brother   ? Cancer Maternal Aunt   ?     breast  ? Cancer Maternal Uncle   ?     lung  ? Stroke Maternal Grandfather   ? Heart disease Maternal Grandfather   ? Hypertension Other   ? Diabetes Other    ? ? ? ?Current Outpatient Medications:  ?  Multiple Vitamin (MULTIVITAMIN) capsule, Take 1 capsule by mouth daily., Disp: , Rfl:  ?  norgestimate-ethinyl estradiol (SPRINTEC 28) 0.25-35 MG-MCG tablet, Take 1 tablet by mouth daily., Disp: 28 tablet, Rfl: 11 ?  vitamin B-12 (CYANOCOBALAMIN) 500 MCG tablet, Take 500 mcg by mouth daily., Disp: , Rfl:  ?  Vitamin D, Cholecalciferol, 10 MCG (400 UNIT) CAPS, Take by mouth., Disp: , Rfl:  ? ?No Known Allergies ? ? ?Review of Systems ?Constitutional: -fever, -chills, -sweats, -unexpected weight change, -decreased appetite, -fatigue ?Allergy: -sneezing, -itching, -congestion ?Dermatology: -changing moles, --rash, -lumps ?ENT: -runny nose, -ear pain, -sore throat, -hoarseness, -sinus pain, -teeth pain, - ringing in ears, -hearing loss, -nosebleeds ?Cardiology: -chest pain, -palpitations, -swelling, -difficulty breathing when lying flat, -waking up short of breath ?Respiratory: -cough, -shortness of breath, -difficulty breathing with exercise or exertion, -wheezing, -coughing up blood ?Gastroenterology: -abdominal pain, -nausea, -vomiting, -diarrhea, -constipation, -blood in stool, -changes in bowel movement, -difficulty swallowing or eating ?Hematology: +heavy menstrual bleeding, -bruising  ?Musculoskeletal: -joint aches, -muscle aches, -joint  swelling, -back pain, -neck pain, -cramping, -changes in gait ?Ophthalmology: denies vision changes, eye redness, itching, discharge ?Urology: -burning with urination, -difficulty urinating, -blood in urine, -urinary frequency, -urgency, -incontinence ?Neurology: -headache, -weakness, -tingling, -numbness, -memory loss, -falls, -dizziness ?Psychology: -depressed mood, -agitation, -sleep problems ?Breast/gyn: -breast tendnerss, -discharge, -lumps, -vaginal discharge,- irregular periods, +heavy periods ? ? ? ?  04/17/2021  ?  8:45 AM 12/14/2018  ? 12:21 PM  ?Depression screen PHQ 2/9  ?Decreased Interest 0 0  ?Down, Depressed, Hopeless 0 0   ?PHQ - 2 Score 0 0  ? ? ?   ?Objective:  ?BP 122/72   Pulse (!) 101   Ht 5\' 10"  (1.778 m)   Wt 200 lb (90.7 kg)   LMP 03/21/2021 (Exact Date)   BMI 28.70 kg/m?  ? ?Physical Exam ?Constitutional:   ?   General: She is not in acute distress. ?   Appearance: Normal appearance.  ?HENT:  ?   Head: Normocephalic and atraumatic.  ?   Right Ear: External ear normal.  ?   Left Ear: External ear normal.  ?   Nose: Nose normal.  ?   Mouth/Throat:  ?   Mouth: Mucous membranes are moist.  ?   Pharynx: No posterior oropharyngeal erythema.  ?Eyes:  ?   Extraocular Movements: Extraocular movements intact.  ?   Conjunctiva/sclera: Conjunctivae normal.  ?   Pupils: Pupils are equal, round, and reactive to light.  ?Neck:  ?   Vascular: No carotid bruit.  ?   Comments: There is asymmetry of left largyn, possible cartilage asymmetry, otherwise normal ?Cardiovascular:  ?   Rate and Rhythm: Normal rate and regular rhythm.  ?   Pulses: Normal pulses.  ?   Heart sounds: No murmur heard. ?Pulmonary:  ?   Effort: Pulmonary effort is normal. No respiratory distress.  ?   Breath sounds: No wheezing or rales.  ?Abdominal:  ?   General: Bowel sounds are normal. There is no distension.  ?   Palpations: Abdomen is soft. There is no mass.  ?   Tenderness: There is abdominal tenderness in the right lower quadrant.  ?   Hernia: No hernia is present.  ?Musculoskeletal:     ?   General: No swelling, tenderness or deformity.  ?   Cervical back: Normal range of motion and neck supple. No tenderness.  ?   Right lower leg: No edema.  ?   Left lower leg: No edema.  ?Lymphadenopathy:  ?   Cervical: Cervical adenopathy present.  ?Skin: ?   General: Skin is warm and dry.  ?   Capillary Refill: Capillary refill takes less than 2 seconds.  ?   Findings: No rash.  ?Neurological:  ?   Mental Status: She is alert and oriented to person, place, and time. Mental status is at baseline.  ?   Cranial Nerves: No cranial nerve deficit.  ?   Sensory: No sensory  deficit.  ?   Motor: No weakness.  ?   Gait: Gait normal.  ?   Deep Tendon Reflexes: Reflexes normal.  ?Psychiatric:     ?   Mood and Affect: Mood normal.     ?   Behavior: Behavior normal.     ?   Judgment: Judgment normal.  ? ?Breast/gyn/rectal - deferred to gynecology  ? ?Assessment and Plan :  ? ?Encounter Diagnoses  ?Name Primary?  ? Encounter for health maintenance examination in adult Yes  ? Menorrhagia with regular cycle   ?  Iron deficiency anemia, unspecified iron deficiency anemia type   ? Smoker   ? Screening for lipid disorders   ? Pneumococcal vaccine refused   ? Neck mass   ? ? ?This visit was a preventative care visit, also known as wellness visit or routine physical.   Topics typically include healthy lifestyle, diet, exercise, preventative care, vaccinations, sick and well care, proper use of emergency dept and after hours care, as well as other concerns.   ? ? ?Recommendations: ?Continue to return yearly for your annual wellness and preventative care visits.  This gives [place] a chance to discuss healthy lifestyle, exercise, vaccinations, review your chart record, and perform screenings where appropriate. ? ?I recommend you see your eye doctor yearly for routine vision care. ? ?I recommend you see your dentist yearly for routine dental care including hygiene visits twice yearly. ? ? ?Vaccination recommendations were reviewed ?Immunization History  ?Administered Date(s) Administered  ? Influenza,inj,Quad PF,6+ Mos 01/17/2014  ? Pneumococcal Polysaccharide-23 01/17/2014  ? Tdap 01/17/2014  ? ? ? ?Screening for cancer: ?Colon cancer screening: ?Age 9 ? ?Breast cancer screening: ?You should perform a self breast exam monthly.   ?We reviewed recommendations for regular mammograms and breast cancer screening. ? ?Cervical cancer screening: ?We reviewed recommendations for pap smear screening.  Referral to gynecology ?  ?Skin cancer screening: ?Check your skin regularly for new changes, growing lesions, or  other lesions of concern ?Come in for evaluation if you have skin lesions of concern. ? ?Lung cancer screening: ?If you have a greater than 20 pack year history of tobacco use, then you may qualify for lung cancer scre

## 2021-04-18 LAB — CBC WITH DIFFERENTIAL/PLATELET
Basophils Absolute: 0.1 10*3/uL (ref 0.0–0.2)
Basos: 1 %
EOS (ABSOLUTE): 0.1 10*3/uL (ref 0.0–0.4)
Eos: 1 %
Hematocrit: 32.5 % — ABNORMAL LOW (ref 34.0–46.6)
Hemoglobin: 9.9 g/dL — ABNORMAL LOW (ref 11.1–15.9)
Immature Grans (Abs): 0 10*3/uL (ref 0.0–0.1)
Immature Granulocytes: 1 %
Lymphocytes Absolute: 1.6 10*3/uL (ref 0.7–3.1)
Lymphs: 27 %
MCH: 23.5 pg — ABNORMAL LOW (ref 26.6–33.0)
MCHC: 30.5 g/dL — ABNORMAL LOW (ref 31.5–35.7)
MCV: 77 fL — ABNORMAL LOW (ref 79–97)
Monocytes Absolute: 0.5 10*3/uL (ref 0.1–0.9)
Monocytes: 8 %
Neutrophils Absolute: 3.7 10*3/uL (ref 1.4–7.0)
Neutrophils: 62 %
Platelets: 364 10*3/uL (ref 150–450)
RBC: 4.21 x10E6/uL (ref 3.77–5.28)
RDW: 18.1 % — ABNORMAL HIGH (ref 11.7–15.4)
WBC: 6 10*3/uL (ref 3.4–10.8)

## 2021-04-18 LAB — COMPREHENSIVE METABOLIC PANEL
ALT: 8 IU/L (ref 0–32)
AST: 14 IU/L (ref 0–40)
Albumin/Globulin Ratio: 1.4 (ref 1.2–2.2)
Albumin: 3.8 g/dL (ref 3.8–4.8)
Alkaline Phosphatase: 55 IU/L (ref 44–121)
BUN/Creatinine Ratio: 12 (ref 9–23)
BUN: 10 mg/dL (ref 6–20)
Bilirubin Total: <0.2 mg/dL (ref 0.0–1.2)
CO2: 22 mmol/L (ref 20–29)
Calcium: 9.2 mg/dL (ref 8.7–10.2)
Chloride: 102 mmol/L (ref 96–106)
Creatinine, Ser: 0.82 mg/dL (ref 0.57–1.00)
Globulin, Total: 2.8 g/dL (ref 1.5–4.5)
Glucose: 93 mg/dL (ref 70–99)
Potassium: 4.6 mmol/L (ref 3.5–5.2)
Sodium: 134 mmol/L (ref 134–144)
Total Protein: 6.6 g/dL (ref 6.0–8.5)
eGFR: 94 mL/min/{1.73_m2} (ref >59)

## 2021-04-18 LAB — IRON,TIBC AND FERRITIN PANEL
Ferritin: 27 ng/mL (ref 15–150)
Iron Saturation: 5 % — CL (ref 15–55)
Iron: 23 ug/dL — ABNORMAL LOW (ref 27–159)
Total Iron Binding Capacity: 427 ug/dL (ref 250–450)
UIBC: 404 ug/dL (ref 131–425)

## 2021-04-18 LAB — LIPID PANEL
Chol/HDL Ratio: 2.2 ratio (ref 0.0–4.4)
Cholesterol, Total: 153 mg/dL (ref 100–199)
HDL: 71 mg/dL (ref >39)
LDL Chol Calc (NIH): 69 mg/dL (ref 0–99)
Triglycerides: 65 mg/dL (ref 0–149)
VLDL Cholesterol Cal: 13 mg/dL (ref 5–40)

## 2021-04-20 ENCOUNTER — Encounter: Payer: Self-pay | Admitting: Emergency Medicine

## 2021-04-20 ENCOUNTER — Other Ambulatory Visit: Payer: Self-pay

## 2021-04-20 ENCOUNTER — Emergency Department
Admission: EM | Admit: 2021-04-20 | Discharge: 2021-04-20 | Disposition: A | Discharge status: Home / Self Care | Payer: No Typology Code available for payment source | Attending: Emergency Medicine | Admitting: Emergency Medicine

## 2021-04-20 DIAGNOSIS — D649 Anemia, unspecified: Secondary | ICD-10-CM | POA: Insufficient documentation

## 2021-04-20 DIAGNOSIS — N939 Abnormal uterine and vaginal bleeding, unspecified: Secondary | ICD-10-CM | POA: Diagnosis not present

## 2021-04-20 LAB — CBC WITH DIFFERENTIAL/PLATELET
Abs Immature Granulocytes: 0.04 10*3/uL (ref 0.00–0.07)
Basophils Absolute: 0.1 10*3/uL (ref 0.0–0.1)
Basophils Relative: 1 %
Eosinophils Absolute: 0.1 10*3/uL (ref 0.0–0.5)
Eosinophils Relative: 1 %
HCT: 34 % — ABNORMAL LOW (ref 36.0–46.0)
Hemoglobin: 10.1 g/dL — ABNORMAL LOW (ref 12.0–15.0)
Immature Granulocytes: 0 %
Lymphocytes Relative: 23 %
Lymphs Abs: 2.1 10*3/uL (ref 0.7–4.0)
MCH: 23.7 pg — ABNORMAL LOW (ref 26.0–34.0)
MCHC: 29.7 g/dL — ABNORMAL LOW (ref 30.0–36.0)
MCV: 79.6 fL — ABNORMAL LOW (ref 80.0–100.0)
Monocytes Absolute: 0.6 10*3/uL (ref 0.1–1.0)
Monocytes Relative: 6 %
Neutro Abs: 6.4 10*3/uL (ref 1.7–7.7)
Neutrophils Relative %: 69 %
Platelets: 349 10*3/uL (ref 150–400)
RBC: 4.27 MIL/uL (ref 3.87–5.11)
RDW: 20.2 % — ABNORMAL HIGH (ref 11.5–15.5)
WBC: 9.2 10*3/uL (ref 4.0–10.5)
nRBC: 0 % (ref 0.0–0.2)

## 2021-04-20 LAB — BASIC METABOLIC PANEL
Anion gap: 7 (ref 5–15)
BUN: 13 mg/dL (ref 6–20)
CO2: 25 mmol/L (ref 22–32)
Calcium: 8.8 mg/dL — ABNORMAL LOW (ref 8.9–10.3)
Chloride: 105 mmol/L (ref 98–111)
Creatinine, Ser: 0.86 mg/dL (ref 0.44–1.00)
GFR, Estimated: >60 mL/min (ref >60)
Glucose, Bld: 100 mg/dL — ABNORMAL HIGH (ref 70–99)
Potassium: 3.6 mmol/L (ref 3.5–5.1)
Sodium: 137 mmol/L (ref 135–145)

## 2021-04-20 LAB — SAMPLE TO BLOOD BANK

## 2021-04-20 MED ORDER — MEDROXYPROGESTERONE ACETATE 10 MG PO TABS: 10.0000 mg | ORAL_TABLET | Freq: Every day | ORAL | 0 refills | Status: DC

## 2021-04-20 NOTE — ED Notes (Signed)
RN unable to get pt VS at d/c due to pt walking out with her paperwork. ?

## 2021-04-20 NOTE — ED Notes (Signed)
POC urine pregnancy test Negative. ?

## 2021-04-20 NOTE — Discharge Instructions (Signed)
Discontinue taking your birth control pills and begin taking the Provera 10 mg 1 daily for the next 10 days.  You can expect to see some withdrawal bleeding after the 10th day but bleeding should be lessened.  Also call [facility] on Tuesday.  Your record was sent to Dr. Valentino Saxon who is on-call.  They will also be able to look over your chart in the office. ?

## 2021-04-20 NOTE — ED Notes (Signed)
RN attempted to D/C pt. Pt sts that she has reservations about leaving. RN notified provider of pt concern. ?

## 2021-04-20 NOTE — ED Provider Notes (Signed)
? ?[facility] ?Provider Note ? ? ? Event Date/Time  ? First MD Initiated Contact with Patient 04/20/21 1136   ?  (approximate) ? ? ?History  ? ?Vaginal Bleeding ? ? ?HPI ? ?Patricia Hayes is a 36 y.o. female   presents to the ED with complaint of vaginal bleeding for the past month.  Patient was seen in the emergency department on 03/22/2021 for abnormal vaginal bleeding.  At that time work-up included a hemoglobin of 8.8 and hematocrit 29.1.  Pregnancy test was negative.  Ultrasound pelvic transvaginal and rule out torsion showed endometrial thickening normal sono graphic appearance of the ovaries and no evidence of torsion.  Patient was started on a prescription for birth control pills in which she took 1 pack and states that there was some slowing down of her.  And then she started bleeding again.  She has started the second pack of birth control pills and reports that the vaginal bleeding continues.  Patient has a history of GERD, anxiety, depression, migraine, uterine cysts.  She rates her pain as a 0/10. ? ? ?Physical Exam  ? ?Triage Vital Signs: ?ED Triage Vitals  ?Enc Vitals Group  ?   BP 04/20/21 1130 (!) 143/79  ?   Pulse Rate 04/20/21 1130 74  ?   Resp 04/20/21 1130 20  ?   Temp 04/20/21 1130 98 ?F (36.7 ?C)  ?   Temp Source 04/20/21 1130 Oral  ?   SpO2 04/20/21 1130 96 %  ?   Weight 04/20/21 1127 200 lb (90.7 kg)  ?   Height 04/20/21 1127 5\' 10"  (1.778 m)  ?   Head Circumference --   ?   Peak Flow --   ?   Pain Score 04/20/21 1127 0  ?   Pain Loc --   ?   Pain Edu? --   ?   Excl. in GC? --   ? ? ?Most recent vital signs: ?Vitals:  ? 04/20/21 1130  ?BP: (!) 143/79  ?Pulse: 74  ?Resp: 20  ?Temp: 98 ?F (36.7 ?C)  ?SpO2: 96%  ? ? ? ?General: Awake, no distress.  ?CV:  Good peripheral perfusion.  Heart regular rate and rhythm. ?Resp:  Normal effort.  Lungs are clear bilaterally. ?Abd:  No distention.  ?  ? ? ?ED Results / Procedures / Treatments  ? ?Labs ?(all labs ordered are listed,  but only abnormal results are displayed) ?Labs Reviewed  ?CBC WITH DIFFERENTIAL/PLATELET - Abnormal; Notable for the following components:  ?    Result Value  ? Hemoglobin 10.1 (*)   ? HCT 34.0 (*)   ? MCV 79.6 (*)   ? MCH 23.7 (*)   ? MCHC 29.7 (*)   ? RDW 20.2 (*)   ? All other components within normal limits  ?BASIC METABOLIC PANEL - Abnormal; Notable for the following components:  ? Glucose, Bld 100 (*)   ? Calcium 8.8 (*)   ? All other components within normal limits  ?POC URINE PREG, ED  ?SAMPLE TO BLOOD BANK  ? ? ? ? ?PROCEDURES: ? ?Critical Care performed:  ? ?Procedures ? ? ?MEDICATIONS ORDERED IN ED: ?Medications - No data to display ? ? ?IMPRESSION / MDM / ASSESSMENT AND PLAN / ED COURSE  ?I reviewed the triage vital signs and the nursing notes. ? ? ?Differential diagnosis includes, but is not limited to, abnormal vaginal bleeding. ? ?36 year old female presents to the ED with complaint of continued vaginal bleeding.  She was seen on 03/22/2021 at which time she was prescribed some birth control pills and also referred [facility].  Patient states she has an appointment on May 18 with an unknown provider.  Lab work today shows a hemoglobin that has improved from 8.8-10.1 with her hematocrit of 34.0.  Remainder of her lab work was unremarkable.  Patient had pictures on her iPhone of her vaginal bleeding that was shown multiple times.  Patient was disgruntled when she was told that the gynecologist would not do an emergent D&C but that I could forward her chart to [facility] to see if they could get her an earlier appointment.  We discussed discontinuing the birth control pills and going with Provera 10 mg 1 daily for the next 10 days.  She was made aware that after the 10th day she would notice some withdrawal vaginal bleeding which is normal.  Patient left expressing that she would go to another hospital.  Patient is hemodynamic stable with blood pressure of 143/79, pulse 74 and  respirations 96.  Patient was ambulatory without any assistance. ? ? ? ? ?FINAL CLINICAL IMPRESSION(S) / ED DIAGNOSES  ? ?Final diagnoses:  ?Abnormal vaginal bleeding  ? ? ? ?Rx / DC Orders  ? ?ED Discharge Orders   ? ?      Ordered  ?  medroxyPROGESTERone (PROVERA) 10 MG tablet  Daily       ? 04/20/21 1355  ? ?  ?  ? ?  ? ? ? ?Note:  This document was prepared using Dragon voice recognition software and may include unintentional dictation errors. ?  ?Tommi Rumps, PA-C ?04/20/21 1544 ? ?  ?Arnaldo Natal, MD ?04/20/21 1550 ? ?

## 2021-04-20 NOTE — ED Notes (Signed)
Provider entered pt room and was met by pt sts " I am going somewhere else, this is ridiculous. You haven't even looked at my pictures or anything." Provider assured pt that she looked at pt test results. Pt than sts " well I just want my paper work and I am leaving, y'all aint even worth a damn." ?

## 2021-04-20 NOTE — ED Triage Notes (Signed)
Pt via POV from home. Pt here for vaginal bleeding for the past month, states she was told her that the lining of her uterus was too thick but states that she was suppose to see an OBGYN and states the follow up was too far out. Pt showed this RN a picture and noted to be very large clots. Denies any weakness or SOB. Pt is A&OX4 and NAD ?

## 2021-04-23 NOTE — Progress Notes (Signed)
? ? ?GYNECOLOGY PROGRESS NOTE ? ?Subjective:  ? ? Patient ID: Patricia Hayes, female    DOB: 01/09/1986, 36 y.o.   MRN: [id number] ? ?HPI ? Patient is a 36 y.o. [id number] female who presents for hospital follow up for abnormal uterine bleeding. She reports that her most recent cycle began on 03/20/2021, and by Daay #2 her bleeding was so heavy that she was changing a pad every 30 minutes.  She was wearing depends to control the bleeding, with passage of heavy clots.  She was seen in the ER at that time and was started on a trial of OCPs. Reports that the bleeding slowed but never truly stopped.  Was seen by her PCP, was initiated on iron tablets. Notes that bleeding became heavier again and was evaluated in the ED on 04/20/2021 for bleeding. Was changed to Provera 10 mg dosing to take for 10 days.  She has been cramping on and off. Bleeding has slowed, now down to light bleeding/spotting.  Of note, patient notes that her cycles have always been  fairlyheavy and painful since onset of menses at age 89.  ? ? ?Gynecologic History:  ?Last pap smear: ~ 2-3 years ago, performed by PCP.  ?Denies h/o STI's.  ? ? ?Menstrual History: ?OB History   ? ? Gravida  ?5  ? Para  ?3  ? Term  ?3  ? Preterm  ?0  ? AB  ?2  ? Living  ?3  ?  ? ? SAB  ?1  ? IAB  ?1  ? Ectopic  ?   ? Multiple  ?   ? Live Births  ?   ?   ?  ?  ?  ?Menarche age: [age] ?Patient's last menstrual period was 03/20/2021 (exact date). ?Period Cycle (Days): 28 ?Period Duration (Days): 7 ?Period Pattern: Regular ?Menstrual Flow: Moderate, Heavy ?Menstrual Control: Maxi pad ?Menstrual Control Change Freq (Hours): 1-2 ?Dysmenorrhea: (!) Severe ?Dysmenorrhea Symptoms: Cramping, Nausea ? ? ? ?Past Medical History:  ?Diagnosis Date  ? Anxiety   ? in past  ? Depression   ? age 36yo  ? Eczema   ? GERD (gastroesophageal reflux disease)   ? Hiatal hernia   ? History of urinary tract infection   ? Migraine   ? Smoker   ? Uterine cyst   ? Wears contact lenses   ? Wears glasses    ? ? ?Past Surgical History:  ?Procedure Laterality Date  ? ESOPHAGOGASTRODUODENOSCOPY  2014  ? hiatal hernia; Dr. Elnoria HowardHung  ? TUBAL LIGATION    ? ? ?Family History  ?Problem Relation Age of Onset  ? Hypertension Mother   ? Cancer Mother   ?     uterine  ? Other Father   ?     unknown  ? Depression Sister   ? Hypertension Brother   ? Hyperlipidemia Brother   ? Diabetes Brother   ? Hyperlipidemia Brother   ? Hypertension Brother   ? Cancer Maternal Aunt   ?     breast  ? Cancer Maternal Uncle   ?     lung  ? Stroke Maternal Grandfather   ? Heart disease Maternal Grandfather   ? Hypertension Other   ? Diabetes Other   ? ? ?Social History  ? ?Socioeconomic History  ? Marital status: Married  ?  Spouse name: Not on file  ? Number of children: Not on file  ? Years of education: Not on file  ? Highest education level: Not  on file  ?Occupational History  ? Not on file  ?Tobacco Use  ? Smoking status: Every Day  ?  Packs/day: 0.50  ?  Years: 18.00  ?  Pack years: 9.00  ?  Types: Cigarettes  ? Smokeless tobacco: Never  ?Vaping Use  ? Vaping Use: Never used  ?Substance and Sexual Activity  ? Alcohol use: Yes  ?  Comment: occasional  ? Drug use: No  ? Sexual activity: Yes  ?  Birth control/protection: Surgical  ?Other Topics Concern  ? Not on file  ?Social History Narrative  ? Lives at home with husband and her 3 kids.  Works in Consulting civil engineer at  [employer].   Exercise - goes to gym 2 days per week.   04/2021  ? ?Social Determinants of Health  ? ?Financial Resource Strain: Not on file  ?Food Insecurity: Not on file  ?Transportation Needs: Not on file  ?Physical Activity: Not on file  ?Stress: Not on file  ?Social Connections: Not on file  ?Intimate Partner Violence: Not on file  ? ? ?Current Outpatient Medications on File Prior to Visit  ?Medication Sig Dispense Refill  ? medroxyPROGESTERone (PROVERA) 10 MG tablet Take 1 tablet (10 mg total) by mouth daily. 10 tablet 0  ? Multiple Vitamin (MULTIVITAMIN) capsule Take 1 capsule by mouth daily.    ?  vitamin B-12 (CYANOCOBALAMIN) 500 MCG tablet Take 500 mcg by mouth daily.    ? Vitamin D, Cholecalciferol, 10 MCG (400 UNIT) CAPS Take by mouth.    ? ?No current facility-administered medications on file prior to visit.  ? ? ?No Known Allergies ? ?Review of Systems ?Pertinent items noted in HPI and remainder of comprehensive ROS otherwise negative.  ? ?Objective:  ? Blood pressure (!) 126/103, pulse 89, resp. rate 16, height 5\' 9"  (1.753 m), weight 199 lb 9.6 oz (90.5 kg), last menstrual period 03/20/2021.  Body mass index is 29.48 kg/m?. ?General appearance: alert and no distress ?Abdomen: soft, non-tender; bowel sounds normal; no masses,  no organomegaly ?Pelvic: external genitalia normal, rectovaginal septum normal.  Vagina with small amount of dark red blood in vaginal vault.   Cervix normal appearing, no lesions and no motion tenderness.  Uterus mobile, nontender, normal shape and size.  Adnexae non-palpable, nontender bilaterally.  ?Extremities: extremities normal, atraumatic, no cyanosis or edema ?Neurologic: Grossly normal ? ? ? ?Labs:  ?Lab Results  ?Component Value Date  ? WBC 9.2 04/20/2021  ? HGB 10.1 (L) 04/20/2021  ? HCT 34.0 (L) 04/20/2021  ? MCV 79.6 (L) 04/20/2021  ? PLT 349 04/20/2021  ? ? ? ?Imaging:  ?06/20/2021 PELVIC COMPLETE W TRANSVAGINAL AND TORSION R/O ?CLINICAL DATA:  Initial evaluation for acute vaginal bleeding. ? ?EXAM: ?TRANSABDOMINAL AND TRANSVAGINAL ULTRASOUND OF PELVIS ? ?DOPPLER ULTRASOUND OF OVARIES ? ?TECHNIQUE: ?Both transabdominal and transvaginal ultrasound examinations of the ?pelvis were performed. Transabdominal technique was performed for ?global imaging of the pelvis including uterus, ovaries, adnexal ?regions, and pelvic cul-de-sac. ? ?It was necessary to proceed with endovaginal exam following the ?transabdominal exam to visualize the endometrium and ovaries. Color ?and duplex Doppler ultrasound was utilized to evaluate blood flow to ?the ovaries. ? ?COMPARISON:  Prior  ultrasound from 09/23/2013. ? ?FINDINGS: ?Uterus ? ?Measurements: 9.6 x 5.9 x 6.5 cm = volume: 194.2 mL. Uterus i ?demonstrates a somewhat globular morphology with poor definition of ?the endometrial-myometrial interface, which could reflect underlying ?adenomyosis. No discrete fibroids. ? ?Endometrium ? ?Endometrial complex somewhat poorly defined, but appears thickened ?up to  approximately 32 mm. Associated heterogeneous echotexture with ?scattered areas of internal vascularity. No other focal abnormality. ? ?Right ovary ? ?Measurements: 2.7 x 1.5 x 1.3 cm = volume: 2.7 mL. Normal ?appearance/no adnexal mass. ? ?Left ovary ? ?Measurements: 2.8 x 2.5 x 3.1 cm = volume: 13.2 mL. Normal ?appearance/no adnexal mass. ? ?Pulsed Doppler evaluation of both ovaries demonstrates normal ?low-resistance arterial and venous waveforms. ? ?Other findings ? ?No abnormal free fluid. ? ?IMPRESSION: ?1. Thickened endometrial complex measuring up to 32 mm with ?associated heterogeneous echotexture and scattered areas of internal ?vascularity. If bleeding remains unresponsive to hormonal or medical ?therapy, focal lesion work-up with sonohysterogram should be ?considered. Endometrial biopsy should also be considered in ?pre-menopausal patients at high risk for endometrial carcinoma. ?(Ref: Radiological Reasoning: Algorithmic Workup of Abnormal Vaginal ?Bleeding with Endovaginal Sonography and Sonohysterography. AJR ?2008; 034:V42-59) ?2. Somewhat globular morphology of the uterus with poor definition ?of the endometrial-myometrial interface, suggesting underlying ?adenomyosis. ?3. Normal sonographic appearance of the ovaries. No evidence for ?torsion. ? ?Electronically Signed ?  By: Rise Mu M.D. ?  On: 03/22/2021 21:40 ? ? ?Assessment:  ? ?1. DUB (dysfunctional uterine bleeding)   ?2. Cervical cancer screening   ?3. Adenomyosis   ?4. Iron deficiency anemia, unspecified iron deficiency anemia type   ?  ? ?Plan:   ? ?Dysfunctional uterine bleeding - patient with a history of menorrhagia, now worsened over the past month.  Failed trial of OCPs, currently on Provera x 10 days.  Recent ultrasound concerning for findings of adenomyosis. Dis

## 2021-04-24 ENCOUNTER — Other Ambulatory Visit (HOSPITAL_COMMUNITY)
Admission: RE | Admit: 2021-04-24 | Discharge: 2021-04-24 | Disposition: A | Discharge status: Home / Self Care | Payer: No Typology Code available for payment source | Source: Ambulatory Visit | Attending: Obstetrics and Gynecology | Admitting: Obstetrics and Gynecology

## 2021-04-24 ENCOUNTER — Ambulatory Visit: Payer: No Typology Code available for payment source | Admitting: Obstetrics and Gynecology

## 2021-04-24 ENCOUNTER — Encounter: Payer: Self-pay | Admitting: Obstetrics and Gynecology

## 2021-04-24 VITALS — BP 126/103 | HR 89 | Resp 16 | Ht 69.0 in | Wt 199.6 lb

## 2021-04-24 DIAGNOSIS — N8003 Adenomyosis of the uterus: Secondary | ICD-10-CM | POA: Diagnosis not present

## 2021-04-24 DIAGNOSIS — N938 Other specified abnormal uterine and vaginal bleeding: Secondary | ICD-10-CM | POA: Diagnosis not present

## 2021-04-24 DIAGNOSIS — Z124 Encounter for screening for malignant neoplasm of cervix: Secondary | ICD-10-CM | POA: Insufficient documentation

## 2021-04-24 DIAGNOSIS — D509 Iron deficiency anemia, unspecified: Secondary | ICD-10-CM | POA: Diagnosis not present

## 2021-04-24 MED ORDER — NORETHINDRONE ACETATE 5 MG PO TABS: 5.0000 mg | ORAL_TABLET | Freq: Every day | ORAL | 0 refills | Status: DC

## 2021-04-25 ENCOUNTER — Encounter: Payer: Self-pay | Admitting: Obstetrics and Gynecology

## 2021-04-30 ENCOUNTER — Other Ambulatory Visit: Payer: No Typology Code available for payment source

## 2021-04-30 ENCOUNTER — Ambulatory Visit
Admission: RE | Admit: 2021-04-30 | Discharge: 2021-04-30 | Disposition: A | Discharge status: Home / Self Care | Payer: No Typology Code available for payment source | Source: Ambulatory Visit | Attending: Medical | Admitting: Medical

## 2021-04-30 DIAGNOSIS — R221 Localized swelling, mass and lump, neck: Secondary | ICD-10-CM

## 2021-04-30 LAB — CYTOLOGY - PAP
Comment: NEGATIVE
Diagnosis: NEGATIVE
High risk HPV: NEGATIVE

## 2021-04-30 MED ORDER — IOPAMIDOL (ISOVUE-300) INJECTION 61%
75.0000 mL | Freq: Once | INTRAVENOUS | Status: AC | PRN
  Administered 2021-04-30: 75 mL via INTRAVENOUS

## 2021-05-06 ENCOUNTER — Other Ambulatory Visit: Payer: Self-pay

## 2021-05-06 ENCOUNTER — Encounter
Admission: RE | Admit: 2021-05-06 | Discharge: 2021-05-06 | Disposition: A | Discharge status: Home / Self Care | Payer: No Typology Code available for payment source | Source: Ambulatory Visit | Attending: Obstetrics and Gynecology | Admitting: Obstetrics and Gynecology

## 2021-05-06 DIAGNOSIS — Z01818 Encounter for other preprocedural examination: Secondary | ICD-10-CM

## 2021-05-06 HISTORY — DX: Anemia, unspecified: D64.9

## 2021-05-06 NOTE — Patient Instructions (Signed)
Your procedure is scheduled on:05-13-21 Monday ?Report to the Registration Desk on the 1st floor of the [facility].Then proceed to the 2nd floor Surgery Desk in the [facility] ?To find out your arrival time, please call [phone] between 1PM - 3PM on:05-10-21 Friday ? ?REMEMBER: ?Instructions that are not followed completely may result in serious medical risk, up to and including death; or upon the discretion of your surgeon and anesthesiologist your surgery may need to be rescheduled. ? ?Do not eat food after midnight the night before surgery.  ?No gum chewing, lozengers or hard candies. ? ?You may however, drink CLEAR liquids up to 2 hours before you are scheduled to arrive for your surgery. Do not drink anything within 2 hours of your scheduled arrival time. ? ?Clear liquids include: ?- water  ?- apple juice without pulp ?- gatorade (not RED colors) ?- black coffee or tea (Do NOT add milk or creamers to the coffee or tea) ?Do NOT drink anything that is not on this list. ? ?Take the following medication the day of surgery with a small sip of water: ?-Famotidine (PEPCID AC)-Take one the night before surgery and one the morning of surgery ? ?One week prior to surgery: ?Stop Anti-inflammatories (NSAIDS) such as Advil, Aleve, Ibuprofen, Motrin, Naproxen, Naprosyn and Aspirin based products such as Excedrin, Goodys Powder, BC Powder.You may however, take Tylenol if needed for pain up until the day of surgery. ? ?Stop ANY OVER THE COUNTER supplements/vitamins NOW (05-06-21) until after surgery (Multivitamin, Vitamin D, Vitamin B12) ? ?No Alcohol for 24 hours before or after surgery. ? ?No Smoking including e-cigarettes for 24 hours prior to surgery.  ?No chewable tobacco products for at least 6 hours prior to surgery.  ?No nicotine patches on the day of surgery. ? ?Do not use any "recreational" drugs for at least a week prior to your surgery.  ?Please be advised that the combination of cocaine and anesthesia may  have negative outcomes, up to and including death. ?If you test positive for cocaine, your surgery will be cancelled. ? ?On the morning of surgery brush your teeth with toothpaste and water, you may rinse your mouth with mouthwash if you wish. ?Do not swallow any toothpaste or mouthwash. ? ?Use CHG Soap as directed on instruction sheet. ? ?Do not wear jewelry, make-up, hairpins, clips or nail polish. ? ?Do not wear lotions, powders, or perfumes.  ? ?Do not shave body from the neck down 48 hours prior to surgery just in case you cut yourself which could leave a site for infection.  ?Also, freshly shaved skin may become irritated if using the CHG soap. ? ?Contact lenses, hearing aids and dentures may not be worn into surgery. ? ?Do not bring valuables to the hospital. [facility] is not responsible for any missing/lost belongings or valuables. ? ?Notify your doctor if there is any change in your medical condition (cold, fever, infection). ? ?Wear comfortable clothing (specific to your surgery type) to the hospital. ? ?After surgery, you can help prevent lung complications by doing breathing exercises.  ?Take deep breaths and cough every 1-2 hours. Your doctor may order a device called an Incentive Spirometer to help you take deep breaths. ?When coughing or sneezing, hold a pillow firmly against your incision with both hands. This is called ?splinting.? Doing this helps protect your incision. It also decreases belly discomfort. ? ?If you are being admitted to the hospital overnight, leave your suitcase in the car. ?After surgery it may  be brought to your room. ? ?If you are being discharged the day of surgery, you will not be allowed to drive home. ?You will need a responsible adult (18 years or older) to drive you home and stay with you that night.  ? ?If you are taking public transportation, you will need to have a responsible adult (18 years or older) with you. ?Please confirm with your physician that it is  acceptable to use public transportation.  ? ?Please call the [facility] at [phone] if you have any questions about these instructions. ?.  ? ?Inpatient Visitation:   ? ?Visiting hours are 7 a.m. to 8 p.m. ?Up to four visitors are allowed at one time in a patient room, including children. The visitors may rotate out with other people during the day. One designated support person (adult) may remain overnight.  ?

## 2021-05-08 ENCOUNTER — Encounter: Payer: Self-pay | Admitting: Obstetrics and Gynecology

## 2021-05-08 ENCOUNTER — Ambulatory Visit: Payer: No Typology Code available for payment source | Admitting: Obstetrics and Gynecology

## 2021-05-08 VITALS — BP 116/79 | HR 80 | Resp 16 | Ht 69.0 in | Wt 193.0 lb

## 2021-05-08 DIAGNOSIS — N938 Other specified abnormal uterine and vaginal bleeding: Secondary | ICD-10-CM

## 2021-05-08 DIAGNOSIS — N8003 Adenomyosis of the uterus: Secondary | ICD-10-CM

## 2021-05-08 DIAGNOSIS — Z01818 Encounter for other preprocedural examination: Secondary | ICD-10-CM

## 2021-05-08 DIAGNOSIS — D509 Iron deficiency anemia, unspecified: Secondary | ICD-10-CM

## 2021-05-08 NOTE — H&P (View-Only) (Signed)
? ? ? ? ?GYNECOLOGY PREOPERATIVE HISTORY AND PHYSICAL  ? ?Subjective:  ?Patricia Hayes is a 36 y.o. [id number] here for surgical management of abnormal uterine bleeding, failed medical management with OCPs and progesterone.  Also with painful heavy cycles since onset of menses. Recent ultrasound suspicious for adenomyosis.  She does have a history of anemia Has had 2 visits to the ER in the past 1-2 months for her bleeding. No significant preoperative concerns. ? ? ?Proposed surgery: LAPAROSCOPIC ASSISTED VAGINAL HYSTERECTOMY WITH SALPINGECTOMY ? ? ?Pertinent Gynecological History: ?Menses: flow is excessive with use of overnight pads or adult diapers on heaviest days ?Bleeding: dysfunctional uterine bleeding ?Contraception: tubal ligation ?Last pap: normal Date: 04/24/2021 ? ? ?Past Medical History:  ?Diagnosis Date  ? Anemia   ? Anxiety   ? in past  ? Depression   ? age 2yo  ? Eczema   ? GERD (gastroesophageal reflux disease)   ? Hiatal hernia   ? History of urinary tract infection   ? Migraine   ? Smoker   ? Uterine cyst   ? Wears contact lenses   ? Wears glasses   ? ? ?Past Surgical History:  ?Procedure Laterality Date  ? ESOPHAGOGASTRODUODENOSCOPY  2014  ? hiatal hernia; Dr. Benson [place]  ? TUBAL LIGATION    ? ? ?OB History  ?Gravida Para Term Preterm AB Living  ?5 3 3  0 2 3  ?SAB IAB Ectopic Multiple Live Births  ?1 1        ?  ?# Outcome Date GA Lbr Len/2nd Weight Sex Delivery Anes PTL Lv  ?5 IAB           ?4 SAB           ?3 Term           ?2 Term           ?1 Term           ? ? ?Family History  ?Problem Relation Age of Onset  ? Hypertension Mother   ? Cancer Mother   ?     uterine  ? Other Father   ?     unknown  ? Depression Sister   ? Hypertension Brother   ? Hyperlipidemia Brother   ? Diabetes Brother   ? Hyperlipidemia Brother   ? Hypertension Brother   ? Cancer Maternal Aunt   ?     breast  ? Cancer Maternal Uncle   ?     lung  ? Stroke Maternal Grandfather   ? Heart disease Maternal Grandfather   ?  Hypertension Other   ? Diabetes Other   ? ? ?Social History  ? ?Socioeconomic History  ? Marital status: Married  ?  Spouse name: Not on file  ? Number of children: Not on file  ? Years of education: Not on file  ? Highest education level: Not on file  ?Occupational History  ? Not on file  ?Tobacco Use  ? Smoking status: Some Days  ?  Packs/day: 0.50  ?  Years: 18.00  ?  Pack years: 9.00  ?  Types: Cigarettes  ? Smokeless tobacco: Never  ?Vaping Use  ? Vaping Use: Never used  ?Substance and Sexual Activity  ? Alcohol use: Yes  ?  Comment: occasional  ? Drug use: No  ? Sexual activity: Yes  ?  Birth control/protection: Surgical  ?Other Topics Concern  ? Not on file  ?Social History Narrative  ? Lives at home with  husband and her 3 kids.  Works in Engineer, technical sales at  [employer].   Exercise - goes to gym 2 days per week.   04/2021  ? ?Social Determinants of Health  ? ?Financial Resource Strain: Not on file  ?Food Insecurity: Not on file  ?Transportation Needs: Not on file  ?Physical Activity: Not on file  ?Stress: Not on file  ?Social Connections: Not on file  ?Intimate Partner Violence: Not on file  ? ? ?Current Outpatient Medications on File Prior to Visit  ?Medication Sig Dispense Refill  ? Famotidine (PEPCID AC PO) Take 1 tablet by mouth as needed.    ? medroxyPROGESTERone (PROVERA) 10 MG tablet Take 1 tablet (10 mg total) by mouth daily. (Patient not taking: Reported on 04/30/2021) 10 tablet 0  ? Multiple Vitamin (MULTIVITAMIN) capsule Take 1 capsule by mouth daily.    ? norethindrone (AYGESTIN) 5 MG tablet Take 1 tablet (5 mg total) by mouth daily. 30 tablet 0  ? vitamin B-12 (CYANOCOBALAMIN) 500 MCG tablet Take 500 mcg by mouth daily.    ? Vitamin D, Cholecalciferol, 10 MCG (400 UNIT) CAPS Take 400 Units by mouth daily.    ? ?No current facility-administered medications on file prior to visit.  ? ? ?No Known Allergies ? ? ? ?Review of Systems ?Constitutional: No recent fever/chills/sweats ?Respiratory: No recent  cough/bronchitis ?Cardiovascular: No chest pain ?Gastrointestinal: No recent nausea/vomiting/diarrhea ?Genitourinary: No UTI symptoms ?Hematologic/lymphatic:No history of coagulopathy or recent blood thinner use  ?  ?Objective:  ? ?Last menstrual period 04/30/2021. Blood pressure 116/79, pulse 80, resp. rate 16, height 5\' 9"  (1.753 m), weight 193 lb (87.5 kg). Body mass index is 28.5 kg/m?. ? ? ?CONSTITUTIONAL: Well-developed, well-nourished female in no acute distress.  ?HENT:  Normocephalic, atraumatic, External right and left ear normal. Oropharynx is clear and moist ?EYES: Conjunctivae and EOM are normal. Pupils are equal, round, and reactive to light. No scleral icterus.  ?NECK: Normal range of motion, supple, no masses ?SKIN: Skin is warm and dry. No rash noted. Not diaphoretic. No erythema. No pallor. ?NEUROLOGIC: Alert and oriented to person, place, and time. Normal reflexes, muscle tone coordination. No cranial nerve deficit noted. ?PSYCHIATRIC: Normal mood and affect. Normal behavior. Normal judgment and thought content. ?CARDIOVASCULAR: Normal heart rate noted, regular rhythm ?RESPIRATORY: Effort and breath sounds normal, no problems with respiration noted ?ABDOMEN: Soft, nontender, nondistended. ?PELVIC: Deferred ?MUSCULOSKELETAL: Normal range of motion. No edema and no tenderness. 2+ distal pulses. ? ? ? ?Labs: ?Lab Results  ?Component Value Date  ? WBC 9.2 04/20/2021  ? HGB 10.1 (L) 04/20/2021  ? HCT 34.0 (L) 04/20/2021  ? MCV 79.6 (L) 04/20/2021  ? PLT 349 04/20/2021  ? ? ? ? ?Imaging Studies: ?Imaging:  ?US PELVIC COMPLETE W TRANSVAGINAL AND TORSION R/O ?CLINICAL DATA:  Initial evaluation for acute vaginal bleeding. ?  ?EXAM: ?TRANSABDOMINAL AND TRANSVAGINAL ULTRASOUND OF PELVIS ?  ?DOPPLER ULTRASOUND OF OVARIES ?  ?TECHNIQUE: ?Both transabdominal and transvaginal ultrasound examinations of the ?pelvis were performed. Transabdominal technique was performed for ?global imaging of the pelvis including  uterus, ovaries, adnexal ?regions, and pelvic cul-de-sac. ?  ?It was necessary to proceed with endovaginal exam following the ?transabdominal exam to visualize the endometrium and ovaries. Color ?and duplex Doppler ultrasound was utilized to evaluate blood flow to ?the ovaries. ?  ?COMPARISON:  Prior ultrasound from 09/23/2013. ?  ?FINDINGS: ?Uterus ?  ?Measurements: 9.6 x 5.9 x 6.5 cm = volume: 194.2 mL. Uterus i ?demonstrates a somewhat globular morphology with  poor definition of ?the endometrial-myometrial interface, which could reflect underlying ?adenomyosis. No discrete fibroids. ?  ?Endometrium ?  ?Endometrial complex somewhat poorly defined, but appears thickened ?up to approximately 32 mm. Associated heterogeneous echotexture with ?scattered areas of internal vascularity. No other focal abnormality. ?  ?Right ovary ?  ?Measurements: 2.7 x 1.5 x 1.3 cm = volume: 2.7 mL. Normal ?appearance/no adnexal mass. ?  ?Left ovary ?  ?Measurements: 2.8 x 2.5 x 3.1 cm = volume: 13.2 mL. Normal ?appearance/no adnexal mass. ?  ?Pulsed Doppler evaluation of both ovaries demonstrates normal ?low-resistance arterial and venous waveforms. ?  ?Other findings ?  ?No abnormal free fluid. ?  ?IMPRESSION: ?1. Thickened endometrial complex measuring up to 32 mm with ?associated heterogeneous echotexture and scattered areas of internal ?vascularity. If bleeding remains unresponsive to hormonal or medical ?therapy, focal lesion work-up with sonohysterogram should be ?considered. Endometrial biopsy should also be considered in ?pre-menopausal patients at high risk for endometrial carcinoma. ?(Ref: Radiological Reasoning: Algorithmic Workup of Abnormal Vaginal ?Bleeding with Endovaginal Sonography and Sonohysterography. AJR ?[id number]) ?2. Somewhat globular morphology of the uterus with poor definition ?of the endometrial-myometrial interface, suggesting underlying ?adenomyosis. ?3. Normal sonographic appearance of the ovaries.  No evidence for ?torsion. ?  ?Electronically Signed ?  By: Jeannine Boga M.D. ?  On: 03/22/2021 21:40 ? ? ?Assessment:  ?  ?  ?1. Preoperative exam for gynecologic surgery   ?2. DUB (dysfunctional uterine bleeding)   ?3.

## 2021-05-08 NOTE — Progress Notes (Signed)
?  See preoperative H&P for details of today's visit. ? ?Hildred Laser, MD ?[facility] ? ?

## 2021-05-08 NOTE — H&P (Signed)
? ? ? ? ?GYNECOLOGY PREOPERATIVE HISTORY AND PHYSICAL  ? ?Subjective:  ?Patricia Hayes is a 35 y.o. [id number] here for surgical management of abnormal uterine bleeding, failed medical management with OCPs and progesterone.  Also with painful heavy cycles since onset of menses. Recent ultrasound suspicious for adenomyosis.  She does have a history of anemia Has had 2 visits to the ER in the past 1-2 months for her bleeding. No significant preoperative concerns. ? ? ?Proposed surgery: LAPAROSCOPIC ASSISTED VAGINAL HYSTERECTOMY WITH SALPINGECTOMY ? ? ?Pertinent Gynecological History: ?Menses: flow is excessive with use of overnight pads or adult diapers on heaviest days ?Bleeding: dysfunctional uterine bleeding ?Contraception: tubal ligation ?Last pap: normal Date: 04/24/2021 ? ? ?Past Medical History:  ?Diagnosis Date  ? Anemia   ? Anxiety   ? in past  ? Depression   ? age 14yo  ? Eczema   ? GERD (gastroesophageal reflux disease)   ? Hiatal hernia   ? History of urinary tract infection   ? Migraine   ? Smoker   ? Uterine cyst   ? Wears contact lenses   ? Wears glasses   ? ? ?Past Surgical History:  ?Procedure Laterality Date  ? ESOPHAGOGASTRODUODENOSCOPY  2014  ? hiatal hernia; Dr. Hung  ? TUBAL LIGATION    ? ? ?OB History  ?Gravida Para Term Preterm AB Living  ?5 3 3 0 2 3  ?SAB IAB Ectopic Multiple Live Births  ?1 1        ?  ?# Outcome Date GA Lbr Len/2nd Weight Sex Delivery Anes PTL Lv  ?5 IAB           ?4 SAB           ?3 Term           ?2 Term           ?1 Term           ? ? ?Family History  ?Problem Relation Age of Onset  ? Hypertension Mother   ? Cancer Mother   ?     uterine  ? Other Father   ?     unknown  ? Depression Sister   ? Hypertension Brother   ? Hyperlipidemia Brother   ? Diabetes Brother   ? Hyperlipidemia Brother   ? Hypertension Brother   ? Cancer Maternal Aunt   ?     breast  ? Cancer Maternal Uncle   ?     lung  ? Stroke Maternal Grandfather   ? Heart disease Maternal Grandfather   ?  Hypertension Other   ? Diabetes Other   ? ? ?Social History  ? ?Socioeconomic History  ? Marital status: Married  ?  Spouse name: Not on file  ? Number of children: Not on file  ? Years of education: Not on file  ? Highest education level: Not on file  ?Occupational History  ? Not on file  ?Tobacco Use  ? Smoking status: Some Days  ?  Packs/day: 0.50  ?  Years: 18.00  ?  Pack years: 9.00  ?  Types: Cigarettes  ? Smokeless tobacco: Never  ?Vaping Use  ? Vaping Use: Never used  ?Substance and Sexual Activity  ? Alcohol use: Yes  ?  Comment: occasional  ? Drug use: No  ? Sexual activity: Yes  ?  Birth control/protection: Surgical  ?Other Topics Concern  ? Not on file  ?Social History Narrative  ? Lives at home with   husband and her 3 kids.  Works in IT at  [employer].   Exercise - goes to gym 2 days per week.   04/2021  ? ?Social Determinants of Health  ? ?Financial Resource Strain: Not on file  ?Food Insecurity: Not on file  ?Transportation Needs: Not on file  ?Physical Activity: Not on file  ?Stress: Not on file  ?Social Connections: Not on file  ?Intimate Partner Violence: Not on file  ? ? ?Current Outpatient Medications on File Prior to Visit  ?Medication Sig Dispense Refill  ? Famotidine (PEPCID AC PO) Take 1 tablet by mouth as needed.    ? medroxyPROGESTERone (PROVERA) 10 MG tablet Take 1 tablet (10 mg total) by mouth daily. (Patient not taking: Reported on 04/30/2021) 10 tablet 0  ? Multiple Vitamin (MULTIVITAMIN) capsule Take 1 capsule by mouth daily.    ? norethindrone (AYGESTIN) 5 MG tablet Take 1 tablet (5 mg total) by mouth daily. 30 tablet 0  ? vitamin B-12 (CYANOCOBALAMIN) 500 MCG tablet Take 500 mcg by mouth daily.    ? Vitamin D, Cholecalciferol, 10 MCG (400 UNIT) CAPS Take 400 Units by mouth daily.    ? ?No current facility-administered medications on file prior to visit.  ? ? ?No Known Allergies ? ? ? ?Review of Systems ?Constitutional: No recent fever/chills/sweats ?Respiratory: No recent  cough/bronchitis ?Cardiovascular: No chest pain ?Gastrointestinal: No recent nausea/vomiting/diarrhea ?Genitourinary: No UTI symptoms ?Hematologic/lymphatic:No history of coagulopathy or recent blood thinner use  ?  ?Objective:  ? ?Last menstrual period 04/30/2021. Blood pressure 116/79, pulse 80, resp. rate 16, height 5' 9" (1.753 m), weight 193 lb (87.5 kg). Body mass index is 28.5 kg/m?. ? ? ?CONSTITUTIONAL: Well-developed, well-nourished female in no acute distress.  ?HENT:  Normocephalic, atraumatic, External right and left ear normal. Oropharynx is clear and moist ?EYES: Conjunctivae and EOM are normal. Pupils are equal, round, and reactive to light. No scleral icterus.  ?NECK: Normal range of motion, supple, no masses ?SKIN: Skin is warm and dry. No rash noted. Not diaphoretic. No erythema. No pallor. ?NEUROLOGIC: Alert and oriented to person, place, and time. Normal reflexes, muscle tone coordination. No cranial nerve deficit noted. ?PSYCHIATRIC: Normal mood and affect. Normal behavior. Normal judgment and thought content. ?CARDIOVASCULAR: Normal heart rate noted, regular rhythm ?RESPIRATORY: Effort and breath sounds normal, no problems with respiration noted ?ABDOMEN: Soft, nontender, nondistended. ?PELVIC: Deferred ?MUSCULOSKELETAL: Normal range of motion. No edema and no tenderness. 2+ distal pulses. ? ? ? ?Labs: ?Lab Results  ?Component Value Date  ? WBC 9.2 04/20/2021  ? HGB 10.1 (L) 04/20/2021  ? HCT 34.0 (L) 04/20/2021  ? MCV 79.6 (L) 04/20/2021  ? PLT 349 04/20/2021  ? ? ? ? ?Imaging Studies: ?Imaging:  ?US PELVIC COMPLETE W TRANSVAGINAL AND TORSION R/O ?CLINICAL DATA:  Initial evaluation for acute vaginal bleeding. ?  ?EXAM: ?TRANSABDOMINAL AND TRANSVAGINAL ULTRASOUND OF PELVIS ?  ?DOPPLER ULTRASOUND OF OVARIES ?  ?TECHNIQUE: ?Both transabdominal and transvaginal ultrasound examinations of the ?pelvis were performed. Transabdominal technique was performed for ?global imaging of the pelvis including  uterus, ovaries, adnexal ?regions, and pelvic cul-de-sac. ?  ?It was necessary to proceed with endovaginal exam following the ?transabdominal exam to visualize the endometrium and ovaries. Color ?and duplex Doppler ultrasound was utilized to evaluate blood flow to ?the ovaries. ?  ?COMPARISON:  Prior ultrasound from 09/23/2013. ?  ?FINDINGS: ?Uterus ?  ?Measurements: 9.6 x 5.9 x 6.5 cm = volume: 194.2 mL. Uterus i ?demonstrates a somewhat globular morphology with   poor definition of ?the endometrial-myometrial interface, which could reflect underlying ?adenomyosis. No discrete fibroids. ?  ?Endometrium ?  ?Endometrial complex somewhat poorly defined, but appears thickened ?up to approximately 32 mm. Associated heterogeneous echotexture with ?scattered areas of internal vascularity. No other focal abnormality. ?  ?Right ovary ?  ?Measurements: 2.7 x 1.5 x 1.3 cm = volume: 2.7 mL. Normal ?appearance/no adnexal mass. ?  ?Left ovary ?  ?Measurements: 2.8 x 2.5 x 3.1 cm = volume: 13.2 mL. Normal ?appearance/no adnexal mass. ?  ?Pulsed Doppler evaluation of both ovaries demonstrates normal ?low-resistance arterial and venous waveforms. ?  ?Other findings ?  ?No abnormal free fluid. ?  ?IMPRESSION: ?1. Thickened endometrial complex measuring up to 32 mm with ?associated heterogeneous echotexture and scattered areas of internal ?vascularity. If bleeding remains unresponsive to hormonal or medical ?therapy, focal lesion work-up with sonohysterogram should be ?considered. Endometrial biopsy should also be considered in ?pre-menopausal patients at high risk for endometrial carcinoma. ?(Ref: Radiological Reasoning: Algorithmic Workup of Abnormal Vaginal ?Bleeding with Endovaginal Sonography and Sonohysterography. AJR ?2008; 191:S68-73) ?2. Somewhat globular morphology of the uterus with poor definition ?of the endometrial-myometrial interface, suggesting underlying ?adenomyosis. ?3. Normal sonographic appearance of the ovaries.  No evidence for ?torsion. ?  ?Electronically Signed ?  By: Benjamin  McClintock M.D. ?  On: 03/22/2021 21:40 ? ? ?Assessment:  ?  ?  ?1. Preoperative exam for gynecologic surgery   ?2. DUB (dysfunctional uterine bleeding)   ?3.

## 2021-05-09 ENCOUNTER — Encounter
Admission: RE | Admit: 2021-05-09 | Discharge: 2021-05-09 | Disposition: A | Discharge status: Home / Self Care | Payer: No Typology Code available for payment source | Source: Ambulatory Visit | Attending: Obstetrics and Gynecology | Admitting: Obstetrics and Gynecology

## 2021-05-09 ENCOUNTER — Encounter: Payer: No Typology Code available for payment source | Admitting: Obstetrics and Gynecology

## 2021-05-09 DIAGNOSIS — Z01812 Encounter for preprocedural laboratory examination: Secondary | ICD-10-CM | POA: Diagnosis present

## 2021-05-09 DIAGNOSIS — Z01818 Encounter for other preprocedural examination: Secondary | ICD-10-CM

## 2021-05-09 LAB — CBC
HCT: 29.9 % — ABNORMAL LOW (ref 36.0–46.0)
Hemoglobin: 8.9 g/dL — ABNORMAL LOW (ref 12.0–15.0)
MCH: 23.3 pg — ABNORMAL LOW (ref 26.0–34.0)
MCHC: 29.8 g/dL — ABNORMAL LOW (ref 30.0–36.0)
MCV: 78.3 fL — ABNORMAL LOW (ref 80.0–100.0)
Platelets: 432 10*3/uL — ABNORMAL HIGH (ref 150–400)
RBC: 3.82 MIL/uL — ABNORMAL LOW (ref 3.87–5.11)
RDW: 19 % — ABNORMAL HIGH (ref 11.5–15.5)
WBC: 7.1 10*3/uL (ref 4.0–10.5)
nRBC: 0 % (ref 0.0–0.2)

## 2021-05-09 LAB — HEPATITIS C ANTIBODY: HCV Ab: NONREACTIVE

## 2021-05-09 LAB — RAPID HIV SCREEN (HIV 1/2 AB+AG)
HIV 1/2 Antibodies: NONREACTIVE
HIV-1 P24 Antigen - HIV24: NONREACTIVE

## 2021-05-09 LAB — TYPE AND SCREEN
ABO/RH(D): B POS
Antibody Screen: NEGATIVE

## 2021-05-10 LAB — RPR: RPR Ser Ql: NONREACTIVE

## 2021-05-12 MED ORDER — CEFAZOLIN SODIUM-DEXTROSE 2-4 GM/100ML-% IV SOLN
2.0000 g | INTRAVENOUS | Status: AC
  Administered 2021-05-13: 2 g via INTRAVENOUS

## 2021-05-12 MED ORDER — ORAL CARE MOUTH RINSE: 15.0000 mL | Freq: Once | OROMUCOSAL | Status: AC

## 2021-05-12 MED ORDER — LACTATED RINGERS IV SOLN: INTRAVENOUS | Status: DC

## 2021-05-12 MED ORDER — CHLORHEXIDINE GLUCONATE 0.12 % MT SOLN: 15.0000 mL | Freq: Once | OROMUCOSAL | Status: AC

## 2021-05-12 MED ORDER — POVIDONE-IODINE 10 % EX SWAB
2.0000 "application " | Freq: Once | CUTANEOUS | Status: AC
  Administered 2021-05-13: 2 via TOPICAL

## 2021-05-13 ENCOUNTER — Ambulatory Visit: Payer: No Typology Code available for payment source | Admitting: Urgent Care

## 2021-05-13 ENCOUNTER — Encounter
Admission: RE | Disposition: A | Discharge status: Home / Self Care | Payer: Self-pay | Source: Ambulatory Visit | Attending: Obstetrics and Gynecology

## 2021-05-13 ENCOUNTER — Other Ambulatory Visit: Payer: Self-pay

## 2021-05-13 ENCOUNTER — Encounter: Payer: Self-pay | Admitting: Obstetrics and Gynecology

## 2021-05-13 ENCOUNTER — Ambulatory Visit
Admission: RE | Admit: 2021-05-13 | Discharge: 2021-05-13 | Disposition: A | Discharge status: Home / Self Care | Payer: No Typology Code available for payment source | Source: Ambulatory Visit | Attending: Obstetrics and Gynecology | Admitting: Obstetrics and Gynecology

## 2021-05-13 DIAGNOSIS — N8003 Adenomyosis of the uterus: Secondary | ICD-10-CM | POA: Diagnosis not present

## 2021-05-13 DIAGNOSIS — N946 Dysmenorrhea, unspecified: Secondary | ICD-10-CM | POA: Insufficient documentation

## 2021-05-13 DIAGNOSIS — D509 Iron deficiency anemia, unspecified: Secondary | ICD-10-CM | POA: Diagnosis not present

## 2021-05-13 DIAGNOSIS — F17200 Nicotine dependence, unspecified, uncomplicated: Secondary | ICD-10-CM | POA: Insufficient documentation

## 2021-05-13 DIAGNOSIS — D500 Iron deficiency anemia secondary to blood loss (chronic): Secondary | ICD-10-CM

## 2021-05-13 DIAGNOSIS — N838 Other noninflammatory disorders of ovary, fallopian tube and broad ligament: Secondary | ICD-10-CM | POA: Insufficient documentation

## 2021-05-13 DIAGNOSIS — K219 Gastro-esophageal reflux disease without esophagitis: Secondary | ICD-10-CM | POA: Diagnosis not present

## 2021-05-13 DIAGNOSIS — N920 Excessive and frequent menstruation with regular cycle: Secondary | ICD-10-CM | POA: Diagnosis not present

## 2021-05-13 DIAGNOSIS — Z9851 Tubal ligation status: Secondary | ICD-10-CM | POA: Diagnosis not present

## 2021-05-13 DIAGNOSIS — N938 Other specified abnormal uterine and vaginal bleeding: Secondary | ICD-10-CM | POA: Insufficient documentation

## 2021-05-13 DIAGNOSIS — Z01818 Encounter for other preprocedural examination: Secondary | ICD-10-CM

## 2021-05-13 HISTORY — PX: LAPAROSCOPIC VAGINAL HYSTERECTOMY WITH SALPINGECTOMY: SHX6680

## 2021-05-13 LAB — POCT PREGNANCY, URINE: Preg Test, Ur: NEGATIVE

## 2021-05-13 SURGERY — HYSTERECTOMY, VAGINAL, LAPAROSCOPY-ASSISTED, WITH SALPINGECTOMY
Anesthesia: General | Site: Abdomen | Laterality: Bilateral

## 2021-05-13 MED ORDER — BUPIVACAINE LIPOSOME 1.3 % IJ SUSP
INTRAMUSCULAR | Status: AC
  Filled 2021-05-13: qty 10

## 2021-05-13 MED ORDER — BUPIVACAINE HCL (PF) 0.5 % IJ SOLN
INTRAMUSCULAR | Status: AC
  Filled 2021-05-13: qty 30

## 2021-05-13 MED ORDER — OXYCODONE HCL 5 MG PO TABS
ORAL_TABLET | ORAL | Status: AC
  Filled 2021-05-13: qty 1

## 2021-05-13 MED ORDER — DEXMEDETOMIDINE HCL IN NACL 200 MCG/50ML IV SOLN
INTRAVENOUS | Status: DC | PRN
  Administered 2021-05-13: 4 ug via INTRAVENOUS
  Administered 2021-05-13: 20 ug via INTRAVENOUS

## 2021-05-13 MED ORDER — VASOPRESSIN 20 UNIT/ML IV SOLN
INTRAVENOUS | Status: DC | PRN
  Administered 2021-05-13: 20 mL via INTRAMUSCULAR

## 2021-05-13 MED ORDER — PROPOFOL 500 MG/50ML IV EMUL
INTRAVENOUS | Status: AC
  Filled 2021-05-13: qty 100

## 2021-05-13 MED ORDER — DOCUSATE SODIUM 100 MG PO CAPS: 100.0000 mg | ORAL_CAPSULE | Freq: Two times a day (BID) | ORAL | 2 refills | Status: DC | PRN

## 2021-05-13 MED ORDER — ACETAMINOPHEN 10 MG/ML IV SOLN
INTRAVENOUS | Status: DC | PRN
  Administered 2021-05-13: 1000 mg via INTRAVENOUS

## 2021-05-13 MED ORDER — FENTANYL CITRATE (PF) 100 MCG/2ML IJ SOLN
INTRAMUSCULAR | Status: AC
  Filled 2021-05-13: qty 2

## 2021-05-13 MED ORDER — ROCURONIUM BROMIDE 100 MG/10ML IV SOLN
INTRAVENOUS | Status: DC | PRN
  Administered 2021-05-13: 50 mg via INTRAVENOUS
  Administered 2021-05-13: 10 mg via INTRAVENOUS

## 2021-05-13 MED ORDER — HYDROMORPHONE HCL 1 MG/ML IJ SOLN
INTRAMUSCULAR | Status: AC
  Filled 2021-05-13: qty 1

## 2021-05-13 MED ORDER — VASOPRESSIN 20 UNIT/ML IV SOLN
INTRAVENOUS | Status: AC
  Filled 2021-05-13: qty 3

## 2021-05-13 MED ORDER — BUPIVACAINE HCL 0.5 % IJ SOLN
INTRAMUSCULAR | Status: DC | PRN
Start: 2021-05-13 — End: 2021-05-13
  Administered 2021-05-13: 10 mL

## 2021-05-13 MED ORDER — SUGAMMADEX SODIUM 500 MG/5ML IV SOLN
INTRAVENOUS | Status: DC | PRN
  Administered 2021-05-13: 400 mg via INTRAVENOUS

## 2021-05-13 MED ORDER — KETAMINE HCL 50 MG/5ML IJ SOSY
PREFILLED_SYRINGE | INTRAMUSCULAR | Status: AC
  Filled 2021-05-13: qty 5

## 2021-05-13 MED ORDER — ONDANSETRON HCL 4 MG/2ML IJ SOLN
INTRAMUSCULAR | Status: DC | PRN
  Administered 2021-05-13 (×2): 4 mg via INTRAVENOUS

## 2021-05-13 MED ORDER — FENTANYL CITRATE (PF) 100 MCG/2ML IJ SOLN
INTRAMUSCULAR | Status: DC | PRN
  Administered 2021-05-13: 25 ug via INTRAVENOUS
  Administered 2021-05-13: 100 ug via INTRAVENOUS
  Administered 2021-05-13: 50 ug via INTRAVENOUS

## 2021-05-13 MED ORDER — MIDAZOLAM HCL 2 MG/2ML IJ SOLN
INTRAMUSCULAR | Status: DC | PRN
  Administered 2021-05-13: 2 mg via INTRAVENOUS

## 2021-05-13 MED ORDER — SODIUM CHLORIDE (PF) 0.9 % IJ SOLN
INTRAMUSCULAR | Status: AC
  Filled 2021-05-13: qty 50

## 2021-05-13 MED ORDER — 0.9 % SODIUM CHLORIDE (POUR BTL) OPTIME
TOPICAL | Status: DC | PRN
  Administered 2021-05-13: 1000 mL

## 2021-05-13 MED ORDER — KETOROLAC TROMETHAMINE 30 MG/ML IJ SOLN
INTRAMUSCULAR | Status: DC | PRN
Start: 2021-05-13 — End: 2021-05-13
  Administered 2021-05-13: 30 mg via INTRAVENOUS

## 2021-05-13 MED ORDER — DEXAMETHASONE SODIUM PHOSPHATE 10 MG/ML IJ SOLN
INTRAMUSCULAR | Status: DC | PRN
  Administered 2021-05-13: 10 mg via INTRAVENOUS

## 2021-05-13 MED ORDER — DROPERIDOL 2.5 MG/ML IJ SOLN
0.6250 mg | Freq: Once | INTRAMUSCULAR | Status: AC
  Filled 2021-05-13: qty 2

## 2021-05-13 MED ORDER — PROPOFOL 10 MG/ML IV BOLUS
INTRAVENOUS | Status: DC | PRN
  Administered 2021-05-13: 200 mg via INTRAVENOUS

## 2021-05-13 MED ORDER — IBUPROFEN 600 MG PO TABS: 600.0000 mg | ORAL_TABLET | Freq: Four times a day (QID) | ORAL | 1 refills | Status: AC | PRN

## 2021-05-13 MED ORDER — SIMETHICONE 80 MG PO CHEW
80.0000 mg | CHEWABLE_TABLET | Freq: Four times a day (QID) | ORAL | 1 refills | Status: DC | PRN
Start: 2021-05-13 — End: 2021-06-25

## 2021-05-13 MED ORDER — OXYCODONE HCL 5 MG PO TABS
5.0000 mg | ORAL_TABLET | Freq: Once | ORAL | Status: AC | PRN
  Administered 2021-05-13: 5 mg via ORAL

## 2021-05-13 MED ORDER — LIDOCAINE HCL (CARDIAC) PF 100 MG/5ML IV SOSY
PREFILLED_SYRINGE | INTRAVENOUS | Status: DC | PRN
Start: 2021-05-13 — End: 2021-05-13
  Administered 2021-05-13: 100 mg via INTRAVENOUS

## 2021-05-13 MED ORDER — KETAMINE HCL 10 MG/ML IJ SOLN
INTRAMUSCULAR | Status: DC | PRN
  Administered 2021-05-13 (×3): 10 mg via INTRAVENOUS

## 2021-05-13 MED ORDER — FENTANYL CITRATE (PF) 100 MCG/2ML IJ SOLN
25.0000 ug | INTRAMUSCULAR | Status: DC | PRN
  Administered 2021-05-13: 25 ug via INTRAVENOUS

## 2021-05-13 MED ORDER — OXYCODONE-ACETAMINOPHEN 5-325 MG PO TABS
1.0000 | ORAL_TABLET | Freq: Four times a day (QID) | ORAL | 0 refills | Status: DC | PRN
Start: 2021-05-13 — End: 2021-05-28

## 2021-05-13 MED ORDER — FAMOTIDINE 20 MG PO TABS: 20.0000 mg | ORAL_TABLET | Freq: Once | ORAL | Status: AC

## 2021-05-13 MED ORDER — HYDROMORPHONE HCL 1 MG/ML IJ SOLN
INTRAMUSCULAR | Status: DC | PRN
  Administered 2021-05-13 (×2): .5 mg via INTRAVENOUS

## 2021-05-13 MED ORDER — GLYCOPYRROLATE 0.2 MG/ML IJ SOLN
INTRAMUSCULAR | Status: DC | PRN
  Administered 2021-05-13: .2 mg via INTRAVENOUS

## 2021-05-13 MED ORDER — MIDAZOLAM HCL 2 MG/2ML IJ SOLN
INTRAMUSCULAR | Status: AC
  Filled 2021-05-13: qty 2

## 2021-05-13 MED ORDER — OXYCODONE HCL 5 MG/5ML PO SOLN: 5.0000 mg | Freq: Once | ORAL | Status: AC | PRN

## 2021-05-13 MED ORDER — CEFAZOLIN SODIUM-DEXTROSE 2-4 GM/100ML-% IV SOLN
INTRAVENOUS | Status: AC
Start: 2021-05-13 — End: 2021-05-13
  Filled 2021-05-13: qty 100

## 2021-05-13 MED ORDER — FAMOTIDINE 20 MG PO TABS
ORAL_TABLET | ORAL | Status: AC
  Administered 2021-05-13: 20 mg via ORAL
  Filled 2021-05-13: qty 1

## 2021-05-13 MED ORDER — DROPERIDOL 2.5 MG/ML IJ SOLN
INTRAMUSCULAR | Status: AC
  Administered 2021-05-13: 0.625 mg via INTRAVENOUS
  Filled 2021-05-13: qty 2

## 2021-05-13 MED ORDER — CHLORHEXIDINE GLUCONATE 0.12 % MT SOLN
OROMUCOSAL | Status: AC
  Administered 2021-05-13: 15 mL via OROMUCOSAL
  Filled 2021-05-13: qty 15

## 2021-05-13 MED ORDER — FENTANYL CITRATE (PF) 100 MCG/2ML IJ SOLN
INTRAMUSCULAR | Status: AC
  Administered 2021-05-13: 25 ug via INTRAVENOUS
  Filled 2021-05-13: qty 2

## 2021-05-13 SURGICAL SUPPLY — 65 items
ADH SKN CLS APL DERMABOND .7 (GAUZE/BANDAGES/DRESSINGS) ×1
APL PRP STRL LF DISP 70% ISPRP (MISCELLANEOUS) ×1
BACTOSHIELD CHG 4% 4OZ (MISCELLANEOUS) ×1
BAG DRN RND TRDRP ANRFLXCHMBR (UROLOGICAL SUPPLIES) ×1
BAG URINE DRAIN 2000ML AR STRL (UROLOGICAL SUPPLIES) ×2 IMPLANT
BLADE SURG 15 STRL LF DISP TIS (BLADE) ×1 IMPLANT
BLADE SURG 15 STRL SS (BLADE) ×2
BLADE SURG SZ10 CARB STEEL (BLADE) ×2 IMPLANT
BLADE SURG SZ11 CARB STEEL (BLADE) ×2 IMPLANT
CATH FOLEY 2WAY  5CC 16FR (CATHETERS) ×1
CATH FOLEY 2WAY 5CC 16FR (CATHETERS) ×1
CATH URTH 16FR FL 2W BLN LF (CATHETERS) ×1 IMPLANT
CHLORAPREP W/TINT 26 (MISCELLANEOUS) ×2 IMPLANT
DERMABOND ADVANCED (GAUZE/BANDAGES/DRESSINGS) ×1
DERMABOND ADVANCED .7 DNX12 (GAUZE/BANDAGES/DRESSINGS) ×1 IMPLANT
ELECT REM PT RETURN 9FT ADLT (ELECTROSURGICAL) ×2
ELECTRODE REM PT RTRN 9FT ADLT (ELECTROSURGICAL) ×1 IMPLANT
GAUZE 4X4 16PLY ~~LOC~~+RFID DBL (SPONGE) ×6 IMPLANT
GAUZE PACK 2X3YD (PACKING) ×2 IMPLANT
GLOVE BIO SURGEON STRL SZ 6.5 (GLOVE) ×2 IMPLANT
GLOVE BIOGEL PI ORTHO PRO 7.5 (GLOVE) ×1
GLOVE PI ORTHO PRO STRL 7.5 (GLOVE) IMPLANT
GLOVE SURG ENC MOIS LTX SZ8 (GLOVE) ×4 IMPLANT
GLOVE SURG POLY ORTHO LF SZ7.5 (GLOVE) ×2 IMPLANT
GLOVE SURG UNDER LTX SZ7 (GLOVE) ×4 IMPLANT
GOWN STRL REUS W/ TWL LRG LVL3 (GOWN DISPOSABLE) ×2 IMPLANT
GOWN STRL REUS W/TWL LRG LVL3 (GOWN DISPOSABLE) ×4
IRRIGATION STRYKERFLOW (MISCELLANEOUS) ×1 IMPLANT
IRRIGATOR STRYKERFLOW (MISCELLANEOUS)
IV LACTATED RINGERS 1000ML (IV SOLUTION) ×1 IMPLANT
KIT PINK PAD W/HEAD ARE REST (MISCELLANEOUS) ×2
KIT PINK PAD W/HEAD ARM REST (MISCELLANEOUS) ×1 IMPLANT
KIT TURNOVER CYSTO (KITS) ×2 IMPLANT
LABEL OR SOLS (LABEL) ×2 IMPLANT
LIGASURE LAP MARYLAND 5MM 37CM (ELECTROSURGICAL) ×2 IMPLANT
MANIFOLD NEPTUNE II (INSTRUMENTS) ×2 IMPLANT
MANIPULATOR VCARE LG CRV RETR (MISCELLANEOUS) IMPLANT
MANIPULATOR VCARE SML CRV RETR (MISCELLANEOUS) IMPLANT
MANIPULATOR VCARE STD CRV RETR (MISCELLANEOUS) IMPLANT
NDL SPNL 22GX3.5 QUINCKE BK (NEEDLE) ×1 IMPLANT
NEEDLE SPNL 22GX3.5 QUINCKE BK (NEEDLE) ×2 IMPLANT
PACK BASIN MINOR ARMC (MISCELLANEOUS) ×2 IMPLANT
PACK GYN LAPAROSCOPIC (MISCELLANEOUS) ×2 IMPLANT
PAD OB MATERNITY 4.3X12.25 (PERSONAL CARE ITEMS) ×2 IMPLANT
SCISSORS METZENBAUM CVD 33 (INSTRUMENTS) ×1 IMPLANT
SCRUB CHG 4% DYNA-HEX 4OZ (MISCELLANEOUS) ×1 IMPLANT
SHEARS HARMONIC ACE PLUS 36CM (ENDOMECHANICALS) IMPLANT
SLEEVE ENDOPATH XCEL 5M (ENDOMECHANICALS) ×4 IMPLANT
STRIP CLOSURE SKIN 1/2X4 (GAUZE/BANDAGES/DRESSINGS) ×1 IMPLANT
SUT MNCRL 4-0 (SUTURE) ×2
SUT MNCRL 4-0 27XMFL (SUTURE) ×1
SUT VIC AB 0 CT1 27 (SUTURE) ×2
SUT VIC AB 0 CT1 27XCR 8 STRN (SUTURE) ×1 IMPLANT
SUT VIC AB 0 CT1 36 (SUTURE) ×2 IMPLANT
SUT VIC AB 0 CT2 27 (SUTURE) ×2 IMPLANT
SUT VIC AB 2-0 CT1 (SUTURE) ×2 IMPLANT
SUT VIC AB 4-0 FS2 27 (SUTURE) ×2 IMPLANT
SUTURE MNCRL 4-0 27XMF (SUTURE) ×1 IMPLANT
SYR 10ML LL (SYRINGE) ×2 IMPLANT
SYR 50ML LL SCALE MARK (SYRINGE) IMPLANT
SYR CONTROL 10ML LL (SYRINGE) ×2 IMPLANT
TAPE TRANSPORE STRL 2 31045 (GAUZE/BANDAGES/DRESSINGS) ×2 IMPLANT
TROCAR XCEL NON-BLD 5MMX100MML (ENDOMECHANICALS) ×2 IMPLANT
TUBING EVAC SMOKE HEATED PNEUM (TUBING) ×2 IMPLANT
WATER STERILE IRR 500ML POUR (IV SOLUTION) ×2 IMPLANT

## 2021-05-13 NOTE — Interval H&P Note (Signed)
History and Physical Interval Note: ? ?05/13/2021 ?7:24 AM ? ?Patricia Hayes  has presented today for surgery, with the diagnosis of Dysfunctional Uterine Bleeding and Iron Deficiency Anemia.  The various methods of treatment have been discussed with the patient and family. After consideration of risks, benefits and other options for treatment, the patient has consented to  Procedure(s): ?LAPAROSCOPIC ASSISTED VAGINAL HYSTERECTOMY WITH SALPINGECTOMY (Bilateral) as a surgical intervention.  The patient's history has been reviewed, patient examined, no change in status, stable for surgery.  I have reviewed the patient's chart and labs.  Questions were answered to the patient's satisfaction.   ? ? ?Hildred Laser, MD ?[facility] ? ? ?

## 2021-05-13 NOTE — Transfer of Care (Signed)
Immediate Anesthesia Transfer of Care Note ? ?Patient: Patricia Hayes ? ?Procedure(s) Performed: LAPAROSCOPIC ASSISTED VAGINAL HYSTERECTOMY WITH SALPINGECTOMY (Bilateral: Abdomen) ? ?Patient Location: PACU ? ?Anesthesia Type:General ? ?Level of Consciousness: drowsy and patient cooperative ? ?Airway & Oxygen Therapy: Patient Spontanous Breathing and Patient connected to face mask oxygen ? ?Post-op Assessment: Report given to RN and Post -op Vital signs reviewed and stable ? ?Post vital signs: Reviewed and stable ? ?Last Vitals:  ?Vitals Value Taken Time  ?BP 140/96 05/13/21 0945  ?Temp 36.1 ?C 05/13/21 0939  ?Pulse 88 05/13/21 0950  ?Resp 11 05/13/21 0947  ?SpO2 100 % 05/13/21 0950  ?Vitals shown include unvalidated device data. ? ?Last Pain:  ?Vitals:  ? 05/13/21 0939  ?TempSrc:   ?PainSc: Asleep  ?   ? ?  ? ?Complications: No notable events documented. ?

## 2021-05-13 NOTE — Discharge Instructions (Signed)

## 2021-05-13 NOTE — Anesthesia Preprocedure Evaluation (Signed)
Anesthesia Evaluation  ?Patient identified by MRN, date of birth, ID band ?Patient awake ? ? ? ?Reviewed: ?Allergy & Precautions, NPO status , Patient's Chart, lab work & pertinent test results ? ?History of Anesthesia Complications ?Negative for: history of anesthetic complications ? ?Airway ?Mallampati: II ? ?TM Distance: >3 FB ?Neck ROM: full ? ? ? Dental ? ?(+) Chipped, Poor Dentition ?  ?Pulmonary ?neg shortness of breath, Current Smoker and Patient abstained from smoking.,  ?  ?Pulmonary exam normal ? ? ? ? ? ? ? Cardiovascular ?Exercise Tolerance: Good ?(-) angina(-) Past MI and (-) DOE negative cardio ROS ?Normal cardiovascular exam ? ? ?  ?Neuro/Psych ? Headaches, PSYCHIATRIC DISORDERS  Neuromuscular disease   ? GI/Hepatic ?Neg liver ROS, hiatal hernia, GERD  Controlled,  ?Endo/Other  ?negative endocrine ROS ? Renal/GU ?  ? ?  ?Musculoskeletal ? ? Abdominal ?  ?Peds ? Hematology ? ?(+) Blood dyscrasia, anemia ,   ?Anesthesia Other Findings ?Past Medical History: ?No date: Anemia ?No date: Anxiety ?    Comment:  in past ?No date: Depression ?    Comment:  age 36yo ?No date: Eczema ?No date: GERD (gastroesophageal reflux disease) ?No date: Hiatal hernia ?No date: History of urinary tract infection ?No date: Migraine ?No date: Smoker ?No date: Uterine cyst ?No date: Wears contact lenses ?No date: Wears glasses ? ?Past Surgical History: ?2014: ESOPHAGOGASTRODUODENOSCOPY ?    Comment:  hiatal hernia; Dr. Benson [place] ?No date: TUBAL LIGATION ? ?BMI   ? Body Mass Index: 28.49 kg/m?  ?  ? ? Reproductive/Obstetrics ?negative OB ROS ? ?  ? ? ? ? ? ? ? ? ? ? ? ? ? ?  ?  ? ? ? ? ? ? ? ? ?Anesthesia Physical ?Anesthesia Plan ? ?ASA: 3 ? ?Anesthesia Plan: General ETT  ? ?Post-op Pain Management:   ? ?Induction: Intravenous ? ?PONV Risk Score and Plan: Ondansetron, Dexamethasone, Midazolam and Treatment may vary due to age or medical condition ? ?Airway Management Planned: Oral ETT ? ?Additional  Equipment:  ? ?Intra-op Plan:  ? ?Post-operative Plan: Extubation in OR ? ?Informed Consent: I have reviewed the patients History and Physical, chart, labs and discussed the procedure including the risks, benefits and alternatives for the proposed anesthesia with the patient or authorized representative who has indicated his/her understanding and acceptance.  ? ? ? ?Dental Advisory Given ? ?Plan Discussed with: Anesthesiologist, CRNA and Surgeon ? ?Anesthesia Plan Comments: (Patient consented for risks of anesthesia including but not limited to:  ?- adverse reactions to medications ?- damage to eyes, teeth, lips or other oral mucosa ?- nerve damage due to positioning  ?- sore throat or hoarseness ?- Damage to heart, brain, nerves, lungs, other parts of body or loss of life ? ?Patient voiced understanding.)  ? ? ? ? ? ? ?Anesthesia Quick Evaluation ? ?

## 2021-05-13 NOTE — Anesthesia Procedure Notes (Signed)
Procedure Name: Intubation ?Date/Time: 05/13/2021 7:37 AM ?Performed by: Mohammed Kindle, CRNA ?Pre-anesthesia Checklist: Patient identified, Emergency Drugs available, Suction available and Patient being monitored ?Patient Re-evaluated:Patient Re-evaluated prior to induction ?Oxygen Delivery Method: Circle system utilized ?Preoxygenation: Pre-oxygenation with 100% oxygen ?Induction Type: IV induction ?Ventilation: Mask ventilation without difficulty ?Laryngoscope Size: McGraph and 3 ?Grade View: Grade I ?Tube type: Oral ?Tube size: 6.5 mm ?Number of attempts: 1 ?Airway Equipment and Method: Stylet and Oral airway ?Placement Confirmation: ETT inserted through vocal cords under direct vision, positive ETCO2, breath sounds checked- equal and bilateral and CO2 detector ?Secured at: 21 cm ?Tube secured with: Tape ?Dental Injury: Teeth and Oropharynx as per pre-operative assessment  ? ? ? ? ?

## 2021-05-13 NOTE — Op Note (Addendum)
Procedure(s): ?LAPAROSCOPIC ASSISTED VAGINAL HYSTERECTOMY WITH SALPINGECTOMY Procedure Note ? ?Patricia Hayes ?female ?36 y.o. ?05/13/2021 ? ?Indications: The patient is a 36 y.o. [phone] female with dysfunctional uterine bleeding, menorrhagia, iron deficiency anemia and dysmenorrhea. Failed progesterone medical management. Ultrasound suspicious for adenomyosis. Previous tubal ligation.  ? ?Pre-operative Diagnosis: Dysfunctional uterine bleeding, iron deficiency anemia, dysmenorrhea, possible adenomyosis. History of prior tubal ligation.  ? ?Post-operative Diagnosis:  Same ? ?Surgeon: Rubie Maid, MD ? ?Assistants:  Jeannie Fend, MD  ? ?Anesthesia: General endotracheal anesthesia ? ?Findings: ?The uterus was sounded to 9 cm. ?Fallopian tubes and ovaries appeared normal.  Fallopian tubes previously surgically interrupted.  ? ?Procedure Details: ?The patient was seen in the Holding Room. The risks, benefits, complications, treatment options, and expected outcomes were discussed with the patient.  The patient concurred with the proposed plan, giving informed consent.  The site of surgery properly noted/marked. The patient was taken to the Operating Room, identified as Patricia Hayes and the procedure verified as Procedure(s) (LRB): LAPAROSCOPIC ASSISTED VAGINAL HYSTERECTOMY WITH SALPINGECTOMY (Bilateral).  ? ?She was then placed under general anesthesia without difficulty. She was placed in the dorsal lithotomy position, and was prepped and draped in a sterile manner.  A Time Out was held and the above information confirmed.  A Foley catheter was inserted into her bladder, and attached to constant drainage.  A uterine manipulator was then advanced into the uterus. After an adequate timeout was performed, attention was turned to the abdomen where an umbilical incision was made with the scalpel.  The Optiview 5-mm trocar and sleeve were then advanced without difficulty with the laparoscope under direct visualization  into the abdomen.  The abdomen was then insufflated with carbon dioxide gas and adequate pneumoperitoneum was obtained.  Bilateral 5-mm lower quadrant ports were then placed under direct visualization.  A survey of the patient's pelvis and abdomen revealed the findings as above.  Fallopian tubes were noted to be previously surgically interrupted bilaterally. Attention was turned to the mesosalpinx of the fallopian tube on the patient's right side, which was clamped using the Ligasure scalpel and ligated.  The round and broad ligaments were then clamped and transected with the Ligasure scalpel.  The ureter were noted to be safely away from the area of dissection.  The uterine artery was then skeletonized and a bladder flap was created.  The bladder was then bluntly dissected off the lower uterine segment.  At this point, attention was turned to the right uterine vessels, which were clamped and ligated using the Ligasure scalpel.  Attention was then turned to the patient's left side, which was treated in a similar manner by taking the mesosalpinx, the round ligament and the broad ligaments and the bladder flap creation was completed. The left uterine vessels were also clamped and transected in a similar fashion.  A survey of the abdomen was performed. No intraoperative injury to other surrounding organs was noted.  The abdomen was desufflated and all instruments were then removed from the patient's abdomen.   All skin incisions were injected with a total of 10 ml of 0.5% Sensorcaine and closed with a 4-0 Monocryl stitch in a subcuticular fashion. The incision were then covered with Dermabond.  ? ?Attention was then turned to her pelvis.  A weighted speculum was then placed in the vagina, and the anterior and posterior lips of the cervix were grasped bilaterally with tenaculums.  The cervix was then injected circumferentially with 20 ml of Vasopressin solution to maintain hemostasis.  The cervix was then  circumferentially incised, and the posterior cul-de-sac was entered sharply without difficulty.  A long weighted speculum was inserted into the posterior cul-de-sac. The Heaney clamp was then used to clamp the uterosacral ligaments on either side.  They were then cut and sutured ligated with 0 Vicryl, and were held with a tag for later identification. Of note, all sutures used in this case were 0 Vicryl unless otherwise noted.   The cardinal ligaments were then clamped, cut and ligated bilaterally.  At this point, full entry into the anterior cul-de-sac was made, no injury to the bladder was noted.  The uterus and fallopian tubes were freed from all ligaments and was then delivered and sent to pathology.  The vaginal cuff was then closed in a running locked fashion with 0 Vicryl with care given to incorporate the uterosacral pedicles bilaterally.  All instruments were then removed from the pelvis. ? ? The patient tolerated the procedures well.  All instruments, needles, and sponge counts were correct  times two. The patient was taken to the recovery room awake, extubated and in stable condition.  ? ?An experienced assistant was required given the standard of surgical care given the complexity of the case.  This assistant was needed for exposure, dissection, suctioning, retraction, instrument exchange, and for overall help during the procedure. ? ? ?Estimated Blood Loss:  200 ml ?     ?Drains: straight catheterization prior to procedure with 100 ml of clear urine ?        ?Total IV Fluids:  900 ml ? ?Specimens: Uterus with bilateral fallopian tubes ?        ?Implants: None ?        ?Complications:  None; patient tolerated the procedure well. ?        ?Disposition: PACU - hemodynamically stable. ?        ?Condition: stable ? ? ?Rubie Maid, MD ?[facility] ? ?

## 2021-05-14 ENCOUNTER — Encounter: Payer: Self-pay | Admitting: Obstetrics and Gynecology

## 2021-05-14 LAB — SURGICAL PATHOLOGY

## 2021-05-14 NOTE — Anesthesia Postprocedure Evaluation (Signed)
Anesthesia Post Note ? ?Patient: Patricia Hayes ? ?Procedure(s) Performed: LAPAROSCOPIC ASSISTED VAGINAL HYSTERECTOMY WITH SALPINGECTOMY (Bilateral: Abdomen) ? ?Patient location during evaluation: PACU ?Anesthesia Type: General ?Level of consciousness: awake and alert ?Pain management: pain level controlled ?Vital Signs Assessment: post-procedure vital signs reviewed and stable ?Respiratory status: spontaneous breathing, nonlabored ventilation and respiratory function stable ?Cardiovascular status: blood pressure returned to baseline and stable ?Postop Assessment: no apparent nausea or vomiting ?Anesthetic complications: no ? ? ?No notable events documented. ? ? ?Last Vitals:  ?Vitals:  ? 05/13/21 1220 05/13/21 1405  ?BP: 127/77 136/86  ?Pulse: 65 78  ?Resp: 15 16  ?Temp:    ?SpO2: 100% 100%  ?  ?Last Pain:  ?Vitals:  ? 05/13/21 1056  ?TempSrc: Temporal  ?PainSc: 2   ? ? ?  ?  ?  ?  ?  ?  ? ?Foye Deer ? ? ? ? ?

## 2021-05-27 NOTE — Progress Notes (Signed)
? ? ?  OBSTETRICS/GYNECOLOGY POST-OPERATIVE CLINIC VISIT ? ?Subjective:  ?  ? Christol Pflieger is a 36 y.o. female who presents to the clinic 2 weeks status post LAPAROSCOPIC ASSISTED VAGINAL HYSTERECTOMY WITH SALPINGECTOMY for  dysfunctional uterine bleeding . Eating a regular diet without difficulty. Bowel movements are normal. The patient is only having some pain when walking or sitting up for a long time.  Using Ibuprofen as needed for pain.  ? ?The following portions of the patient's history were reviewed and updated as appropriate: allergies, current medications, past family history, past medical history, past social history, past surgical history, and problem list. ? ?Review of Systems ?Pertinent items are noted in HPI. ?  ?Objective:  ? ?BP 101/74   Pulse 84   Wt 192 lb 8 oz (87.3 kg)   LMP 04/30/2021 (Within Days)   BMI 28.43 kg/m?  Body mass index is 28.43 kg/m?. ? ?General:  alert and no distress  ?Abdomen: soft, bowel sounds active, non-tender  ?Incision:   healing well, no drainage, no erythema, no hernia, no seroma, no swelling, no dehiscence, incision well approximated  ? ? ?Pathology:  ?A. UTERUS WITH CERVIX AND BILATERAL FALLOPIAN TUBES; TOTAL HYSTERECTOMY  ?WITH BILATERAL SALPINGECTOMY:  ?- UTERINE CERVIX:  ?     - BENIGN TRANSFORMATION ZONE.  ?     - NEGATIVE FOR SQUAMOUS INTRAEPITHELIAL LESION AND MALIGNANCY.  ?- ENDOMETRIUM:  ?     - PROLIFERATIVE PHASE ENDOMETRIUM.  ?     - NEGATIVE FOR ATYPICAL HYPERPLASIA/EIN AND MALIGNANCY.  ?- MYOMETRIUM:  ?     - ADENOMYOSIS.  ?     - NEGATIVE FOR FEATURES OF MALIGNANCY.  ?- FALLOPIAN TUBES:  ?     - BENIGN PARATUBAL CYSTS.  ?     - OTHERWISE NO SIGNIFICANT HISTOPATHOLOGIC CHANGE.  ? ? ?Assessment:  ? ?Patient s/p LAPAROSCOPIC ASSISTED VAGINAL HYSTERECTOMY WITH SALPINGECTOMY (surgery)  ?Doing well postoperatively. ?Adenomyosis ?  ?Plan:  ? ?1. Continue any current medications as instructed by provider. ?2. Wound care discussed. ?3. Operative findings  again reviewed. Pathology report discussed. ?4. Activity restrictions: no bending, stooping, or squatting, no lifting more than 10-15 pounds, and pelvic rest ?5. Anticipated return to work: 2-3 weeks. ?6. Follow up:  3-4  weeks for final post-operative check.  ? ? ? ?Hildred Laser, MD ?[facility] ? ? ?

## 2021-05-28 ENCOUNTER — Encounter: Payer: Self-pay | Admitting: Obstetrics and Gynecology

## 2021-05-28 ENCOUNTER — Ambulatory Visit (INDEPENDENT_AMBULATORY_CARE_PROVIDER_SITE_OTHER): Payer: No Typology Code available for payment source | Admitting: Obstetrics and Gynecology

## 2021-05-28 VITALS — BP 101/74 | HR 84 | Ht 69.0 in | Wt 192.5 lb

## 2021-05-28 DIAGNOSIS — N8003 Adenomyosis of the uterus: Secondary | ICD-10-CM

## 2021-05-28 DIAGNOSIS — Z90710 Acquired absence of both cervix and uterus: Secondary | ICD-10-CM

## 2021-05-28 DIAGNOSIS — Z4889 Encounter for other specified surgical aftercare: Secondary | ICD-10-CM

## 2021-05-30 ENCOUNTER — Encounter: Payer: No Typology Code available for payment source | Admitting: Obstetrics and Gynecology

## 2021-06-07 ENCOUNTER — Encounter: Payer: Self-pay | Admitting: Obstetrics and Gynecology

## 2021-06-25 ENCOUNTER — Encounter: Payer: Self-pay | Admitting: Obstetrics and Gynecology

## 2021-06-25 ENCOUNTER — Ambulatory Visit (INDEPENDENT_AMBULATORY_CARE_PROVIDER_SITE_OTHER): Payer: No Typology Code available for payment source | Admitting: Obstetrics and Gynecology

## 2021-06-25 VITALS — BP 120/68 | HR 98 | Ht 68.0 in | Wt 197.0 lb

## 2021-06-25 DIAGNOSIS — Z4889 Encounter for other specified surgical aftercare: Secondary | ICD-10-CM

## 2021-06-25 DIAGNOSIS — Z90710 Acquired absence of both cervix and uterus: Secondary | ICD-10-CM

## 2021-06-25 DIAGNOSIS — D500 Iron deficiency anemia secondary to blood loss (chronic): Secondary | ICD-10-CM

## 2021-06-25 DIAGNOSIS — N8003 Adenomyosis of the uterus: Secondary | ICD-10-CM

## 2021-06-25 NOTE — Progress Notes (Signed)
    OBSTETRICS/GYNECOLOGY POST-OPERATIVE CLINIC VISIT  Subjective:     Patricia Hayes is a 36 y.o. female who presents to the clinic 4 weeks status post [place] SALPINGECTOMY  for dysfunctional uterine bleeding . Eating a regular diet without difficulty. Bowel movements are normal. The patient is not having any pain.  The following portions of the patient's history were reviewed and updated as appropriate: allergies, current medications, past family history, past medical history, past social history, past surgical history, and problem list.  Review of Systems Pertinent items are noted in HPI.   Objective:   BP 120/68   Pulse 98   Ht 5\' 8"  (1.727 m)   Wt 197 lb (89.4 kg)   SpO2 100%   BMI 29.95 kg/m  Body mass index is 29.95 kg/m.  General:  alert and no distress  Abdomen: soft, bowel sounds active, non-tender  Incision:   Well-healed, well-approximated  Pelvis:  External genitalia normal. Vagina with scant thin white discharge, no odor. Small amount of residual suture material present, removed with wiping with Q-tip. Vaginal cuff well-healed. Bimanual exam not performed.     Labs:  Lab Results  Component Value Date   WBC 7.1 05/09/2021   HGB 8.9 (L) 05/09/2021   HCT 29.9 (L) 05/09/2021   MCV 78.3 (L) 05/09/2021   PLT 432 (H) 05/09/2021     Pathology:  A. UTERUS WITH CERVIX AND BILATERAL FALLOPIAN TUBES; TOTAL HYSTERECTOMY  WITH BILATERAL SALPINGECTOMY:  - UTERINE CERVIX:       - BENIGN TRANSFORMATION ZONE.       - NEGATIVE FOR SQUAMOUS INTRAEPITHELIAL LESION AND MALIGNANCY.  - ENDOMETRIUM:       - PROLIFERATIVE PHASE ENDOMETRIUM.       - NEGATIVE FOR ATYPICAL HYPERPLASIA/EIN AND MALIGNANCY.  - MYOMETRIUM:       - ADENOMYOSIS.       - NEGATIVE FOR FEATURES OF MALIGNANCY.  - FALLOPIAN TUBES:       - BENIGN PARATUBAL CYSTS.       - OTHERWISE NO SIGNIFICANT HISTOPATHOLOGIC CHANGE.   Assessment:   Patient s/p LAPAROSCOPIC  ASSISTED VAGINAL HYSTERECTOMY WITH SALPINGECTOMY  (surgery)  Doing well postoperatively. Adenomyosis Anemia, iron deficiency  Plan:   1. Continue any current medications as instructed by provider. Should continue iron supplementation for 1 additional month 2. Will recheck Hgb at annual exam. No further bleeding so anemia should improve.  3. Operative findings again reviewed. Pathology report discussed. 4. Activity restrictions: none 5. Anticipated return to work: Returned to work 2 weeks ago 6. Follow up:  4-52months  for annual exam    Rubie Maid, MD [facility]

## 2021-07-22 ENCOUNTER — Ambulatory Visit: Payer: No Typology Code available for payment source | Admitting: Nurse Practitioner

## 2021-08-30 ENCOUNTER — Ambulatory Visit: Payer: No Typology Code available for payment source | Admitting: Nurse Practitioner

## 2021-09-18 ENCOUNTER — Encounter: Payer: Self-pay | Admitting: Internal Medicine

## 2021-10-22 ENCOUNTER — Encounter: Payer: Self-pay | Admitting: Internal Medicine

## 2021-10-28 ENCOUNTER — Emergency Department
Admission: EM | Admit: 2021-10-28 | Discharge: 2021-10-28 | Discharge status: Against Medical Advice | Payer: No Typology Code available for payment source | Attending: Emergency Medicine | Admitting: Emergency Medicine

## 2021-10-28 ENCOUNTER — Other Ambulatory Visit: Payer: Self-pay

## 2021-10-28 ENCOUNTER — Encounter: Payer: Self-pay | Admitting: Emergency Medicine

## 2021-10-28 DIAGNOSIS — R519 Headache, unspecified: Secondary | ICD-10-CM | POA: Diagnosis present

## 2021-10-28 DIAGNOSIS — R110 Nausea: Secondary | ICD-10-CM | POA: Insufficient documentation

## 2021-10-28 DIAGNOSIS — Z1152 Encounter for screening for COVID-19: Secondary | ICD-10-CM | POA: Insufficient documentation

## 2021-10-28 DIAGNOSIS — Z5321 Procedure and treatment not carried out due to patient leaving prior to being seen by health care provider: Secondary | ICD-10-CM | POA: Diagnosis not present

## 2021-10-28 DIAGNOSIS — R5383 Other fatigue: Secondary | ICD-10-CM | POA: Diagnosis not present

## 2021-10-28 LAB — CBC WITH DIFFERENTIAL/PLATELET
Abs Immature Granulocytes: 0.03 10*3/uL (ref 0.00–0.07)
Basophils Absolute: 0.1 10*3/uL (ref 0.0–0.1)
Basophils Relative: 1 %
Eosinophils Absolute: 0.1 10*3/uL (ref 0.0–0.5)
Eosinophils Relative: 1 %
HCT: 41.1 % (ref 36.0–46.0)
Hemoglobin: 13.6 g/dL (ref 12.0–15.0)
Immature Granulocytes: 0 %
Lymphocytes Relative: 25 %
Lymphs Abs: 2.7 10*3/uL (ref 0.7–4.0)
MCH: 27.5 pg (ref 26.0–34.0)
MCHC: 33.1 g/dL (ref 30.0–36.0)
MCV: 83.2 fL (ref 80.0–100.0)
Monocytes Absolute: 0.9 10*3/uL (ref 0.1–1.0)
Monocytes Relative: 8 %
Neutro Abs: 7.1 10*3/uL (ref 1.7–7.7)
Neutrophils Relative %: 65 %
Platelets: 316 10*3/uL (ref 150–400)
RBC: 4.94 MIL/uL (ref 3.87–5.11)
RDW: 14.2 % (ref 11.5–15.5)
WBC: 10.9 10*3/uL — ABNORMAL HIGH (ref 4.0–10.5)
nRBC: 0 % (ref 0.0–0.2)

## 2021-10-28 LAB — COMPREHENSIVE METABOLIC PANEL
ALT: 13 U/L (ref 0–44)
AST: 18 U/L (ref 15–41)
Albumin: 4.1 g/dL (ref 3.5–5.0)
Alkaline Phosphatase: 60 U/L (ref 38–126)
Anion gap: 6 (ref 5–15)
BUN: 14 mg/dL (ref 6–20)
CO2: 24 mmol/L (ref 22–32)
Calcium: 9.5 mg/dL (ref 8.9–10.3)
Chloride: 106 mmol/L (ref 98–111)
Creatinine, Ser: 0.77 mg/dL (ref 0.44–1.00)
GFR, Estimated: >60 mL/min (ref >60)
Glucose, Bld: 106 mg/dL — ABNORMAL HIGH (ref 70–99)
Potassium: 3.8 mmol/L (ref 3.5–5.1)
Sodium: 136 mmol/L (ref 135–145)
Total Bilirubin: 0.6 mg/dL (ref 0.3–1.2)
Total Protein: 7.7 g/dL (ref 6.5–8.1)

## 2021-10-28 LAB — RESP PANEL BY RT-PCR (FLU A&B, COVID) ARPGX2
Influenza A by PCR: NEGATIVE
Influenza B by PCR: NEGATIVE
SARS Coronavirus 2 by RT PCR: NEGATIVE

## 2021-10-28 MED ORDER — KETOROLAC TROMETHAMINE 15 MG/ML IJ SOLN
15.0000 mg | Freq: Once | INTRAMUSCULAR | Status: AC
  Administered 2021-10-28: 15 mg via INTRAVENOUS
  Filled 2021-10-28: qty 1

## 2021-10-28 MED ORDER — PROCHLORPERAZINE EDISYLATE 10 MG/2ML IJ SOLN
10.0000 mg | Freq: Once | INTRAMUSCULAR | Status: AC
  Administered 2021-10-28: 10 mg via INTRAVENOUS
  Filled 2021-10-28: qty 2

## 2021-10-28 MED ORDER — ACETAMINOPHEN 500 MG PO TABS
1000.0000 mg | ORAL_TABLET | Freq: Once | ORAL | Status: DC
Start: 2021-10-28 — End: 2021-10-29

## 2021-10-28 NOTE — ED Provider Triage Note (Signed)
  Emergency Medicine Provider Triage Evaluation Note  Patricia Hayes , a 36 y.o.female,  was evaluated in triage.  Pt complains of headache.  Patient states that she has been feeling fatigued and unwell for the past couple days.  Reports body aches and severe headache.  She states that she normally gets migraines when she is sick.  She has already taken Excedrin, however does not help.  Endorses nausea as well.   Review of Systems  Positive: Headache, nausea, body aches Negative: Denies fever, chest pain, vomiting  Physical Exam   Vitals:   10/28/21 2120  BP: (!) 137/99  Pulse: (!) 112  Resp: 18  Temp: 99.1 F (37.3 C)  SpO2: 100%   Gen:   Awake, appears uncomfortable. Resp:  Normal effort  MSK:   Moves extremities without difficulty  Other:    Medical Decision Making  Given the patient's initial medical screening exam, the following diagnostic evaluation has been ordered. The patient will be placed in the appropriate treatment space, once one is available, to complete the evaluation and treatment. I have discussed the plan of care with the patient and I have advised the patient that an ED physician or mid-level practitioner will reevaluate their condition after the test results have been received, as the results may give them additional insight into the type of treatment they may need.    Diagnostics: Labs, respiratory panel  Treatments: Ketorolac, Compazine   Teodoro Spray, [place] 10/28/21 2141

## 2021-10-28 NOTE — ED Triage Notes (Signed)
Pt arrives with c/o headache, body aches, chills, and n/v that started last night.

## 2021-10-29 ENCOUNTER — Telehealth: Payer: Self-pay | Admitting: Medical

## 2021-10-29 NOTE — Telephone Encounter (Signed)
Transition Care Management Follow-up Telephone Call Date of discharge and from where: 10/28/2021 [facility] How have you been since you were released from the hospital? no Any questions or concerns? Yes  Items Reviewed: Did the pt receive and understand the discharge instructions provided? Yes  Medications obtained and verified? Yes  Other? Yes  Any new allergies since your discharge? No  Dietary orders reviewed? No Do you have support at home? Yes   H Follow up appointments reviewed:  PCP Hospital f/u appt confirmed? Pt declined stating she was going to a Urgent care near her home     Are transportation arrangements needed? No  If their condition worsens, is the pt aware to call PCP or go to the Emergency Dept.? Yes Was the patient provided with contact information for the PCP's office or ED? Yes Was to pt encouraged to call back with questions or concerns? Yes

## 2022-02-10 NOTE — Progress Notes (Unsigned)
LMP 04/30/2021 (Within Days) Comment: Negative Test (05/13/2021)   Subjective:    Patient ID: Patricia Hayes, female    DOB: 04-May-1985, 37 y.o.   MRN: [id number]  HPI: Patricia Hayes is a 37 y.o. female  No chief complaint on file.  Patient presents to clinic to establish care with new PCP.  Introduced to Designer, jewellery role and practice setting.  All questions answered.  Discussed provider/patient relationship and expectations.  Patient reports a history of ***. Patient denies a history of: Hypertension, Elevated Cholesterol, Diabetes, Thyroid problems, Depression, Anxiety, Neurological problems, and Abdominal problems.   Active Ambulatory Problems    Diagnosis Date Noted   Eczema 01/17/2014   Irregular periods 01/17/2014   Depression 01/17/2014   Chronic nausea 01/17/2014   Other migraine without status migrainosus, not intractable 01/17/2014   Screening for lipid disorders 12/14/2018   Tobacco use disorder 12/14/2018   Influenza vaccination declined 12/14/2018   SOB (shortness of breath) 12/14/2018   Encounter for health maintenance examination in adult 04/17/2021   Menorrhagia with regular cycle 04/17/2021   Iron deficiency anemia 04/17/2021   Smoker 04/17/2021   Pneumococcal vaccine refused 04/17/2021   Neck mass 04/17/2021   Resolved Ambulatory Problems    Diagnosis Date Noted   No Resolved Ambulatory Problems   Past Medical History:  Diagnosis Date   Anemia    Anxiety    GERD (gastroesophageal reflux disease)    Hiatal hernia    History of urinary tract infection    Migraine    Uterine cyst    Wears contact lenses    Wears glasses    Past Surgical History:  Procedure Laterality Date   ESOPHAGOGASTRODUODENOSCOPY  2014   hiatal hernia; Dr. Benson [place]   LAPAROSCOPIC VAGINAL HYSTERECTOMY WITH SALPINGECTOMY Bilateral 05/13/2021   Procedure: LAPAROSCOPIC ASSISTED VAGINAL HYSTERECTOMY WITH SALPINGECTOMY;  Surgeon: Rubie Maid, MD;  Location: [facility] ORS;   Service: Gynecology;  Laterality: Bilateral;   TUBAL LIGATION     Family History  Problem Relation Age of Onset   Hypertension Mother    Cancer Mother        uterine   Other Father        unknown   Depression Sister    Hypertension Brother    Hyperlipidemia Brother    Diabetes Brother    Hyperlipidemia Brother    Hypertension Brother    Cancer Maternal Aunt        breast   Cancer Maternal Uncle        lung   Stroke Maternal Grandfather    Heart disease Maternal Grandfather    Hypertension Other    Diabetes Other      Review of Systems  Per HPI unless specifically indicated above     Objective:    LMP 04/30/2021 (Within Days) Comment: Negative Test (05/13/2021)  Wt Readings from Last 3 Encounters:  10/28/21 180 lb (81.6 kg)  06/25/21 197 lb (89.4 kg)  05/28/21 192 lb 8 oz (87.3 kg)    Physical Exam  Results for orders placed or performed during the hospital encounter of 10/28/21  Resp Panel by RT-PCR (Flu A&B, Covid) Anterior Nasal Swab   Specimen: Anterior Nasal Swab  Result Value Ref Range   SARS Coronavirus 2 by RT PCR NEGATIVE NEGATIVE   Influenza A by PCR NEGATIVE NEGATIVE   Influenza B by PCR NEGATIVE NEGATIVE  CBC with Differential  Result Value Ref Range   WBC 10.9 (H) 4.0 - 10.5 K/uL   RBC 4.94  3.87 - 5.11 MIL/uL   Hemoglobin 13.6 12.0 - 15.0 g/dL   HCT 41.1 36.0 - 46.0 %   MCV 83.2 80.0 - 100.0 fL   MCH 27.5 26.0 - 34.0 pg   MCHC 33.1 30.0 - 36.0 g/dL   RDW 14.2 11.5 - 15.5 %   Platelets 316 150 - 400 K/uL   nRBC 0.0 0.0 - 0.2 %   Neutrophils Relative % 65 %   Neutro Abs 7.1 1.7 - 7.7 K/uL   Lymphocytes Relative 25 %   Lymphs Abs 2.7 0.7 - 4.0 K/uL   Monocytes Relative 8 %   Monocytes Absolute 0.9 0.1 - 1.0 K/uL   Eosinophils Relative 1 %   Eosinophils Absolute 0.1 0.0 - 0.5 K/uL   Basophils Relative 1 %   Basophils Absolute 0.1 0.0 - 0.1 K/uL   Immature Granulocytes 0 %   Abs Immature Granulocytes 0.03 0.00 - 0.07 K/uL  Comprehensive  metabolic panel  Result Value Ref Range   Sodium 136 135 - 145 mmol/L   Potassium 3.8 3.5 - 5.1 mmol/L   Chloride 106 98 - 111 mmol/L   CO2 24 22 - 32 mmol/L   Glucose, Bld 106 (H) 70 - 99 mg/dL   BUN 14 6 - 20 mg/dL   Creatinine, Ser 0.77 0.44 - 1.00 mg/dL   Calcium 9.5 8.9 - 10.3 mg/dL   Total Protein 7.7 6.5 - 8.1 g/dL   Albumin 4.1 3.5 - 5.0 g/dL   AST 18 15 - 41 U/L   ALT 13 0 - 44 U/L   Alkaline Phosphatase 60 38 - 126 U/L   Total Bilirubin 0.6 0.3 - 1.2 mg/dL   GFR, Estimated >60 >60 mL/min   Anion gap 6 5 - 15      Assessment & Plan:   Problem List Items Addressed This Visit   None    Follow up plan: No follow-ups on file.

## 2022-02-11 ENCOUNTER — Ambulatory Visit: Payer: 59 | Admitting: Nurse Practitioner

## 2022-02-11 ENCOUNTER — Encounter: Payer: Self-pay | Admitting: Nurse Practitioner

## 2022-02-11 VITALS — BP 112/75 | HR 84 | Temp 98.8°F | Ht 68.2 in | Wt 202.1 lb

## 2022-02-11 DIAGNOSIS — G8929 Other chronic pain: Secondary | ICD-10-CM

## 2022-02-11 DIAGNOSIS — L2082 Flexural eczema: Secondary | ICD-10-CM | POA: Diagnosis not present

## 2022-02-11 DIAGNOSIS — Z7689 Persons encountering health services in other specified circumstances: Secondary | ICD-10-CM | POA: Diagnosis not present

## 2022-02-11 DIAGNOSIS — M545 Low back pain, unspecified: Secondary | ICD-10-CM

## 2022-02-11 MED ORDER — TRIAMCINOLONE ACETONIDE 0.1 % EX CREA: 1.0000 | TOPICAL_CREAM | Freq: Two times a day (BID) | CUTANEOUS | 1 refills | Status: DC

## 2022-02-11 NOTE — Patient Instructions (Addendum)
Trizepatide  [employer]

## 2022-02-11 NOTE — Assessment & Plan Note (Signed)
Will treat with Triamcinalone cream. Discussed how to properly use medication.  Follow up if not improved.

## 2022-03-18 NOTE — Progress Notes (Deleted)
LMP 04/30/2021 (Within Days) Comment: Negative Test (05/13/2021)   Subjective:    Patient ID: Patricia Hayes, female    DOB: 05/03/1985, 37 y.o.   MRN: [id number]  HPI: Patricia Hayes is a 37 y.o. female presenting on 03/19/2022 for comprehensive medical examination. Current medical complaints include:{Blank single:19197::"none","***"}  She currently lives with: Menopausal Symptoms: {Blank single:19197::"yes","no"}  Depression Screen done today and results listed below:     02/11/2022    8:59 AM 04/17/2021    8:45 AM 12/14/2018   12:21 PM  Depression screen PHQ 2/9  Decreased Interest 0 0 0  Down, Depressed, Hopeless 1 0 0  PHQ - 2 Score 1 0 0  Altered sleeping 0    Tired, decreased energy 1    Change in appetite 0    Feeling bad or failure about yourself  0    Trouble concentrating 0    Moving slowly or fidgety/restless 0    Suicidal thoughts 0    PHQ-9 Score 2    Difficult doing work/chores Not difficult at all      The patient {has/does not have:19849} a history of falls. I {did/did not:19850} complete a risk assessment for falls. A plan of care for falls {was/was not:19852} documented.   Past Medical History:  Past Medical History:  Diagnosis Date   Anemia    Anxiety    in past   Depression    age 57yo   Eczema    GERD (gastroesophageal reflux disease)    Hiatal hernia    History of urinary tract infection    Migraine    Smoker    Uterine cyst    Wears contact lenses    Wears glasses     Surgical History:  Past Surgical History:  Procedure Laterality Date   ESOPHAGOGASTRODUODENOSCOPY  2014   hiatal hernia; Dr. Benson [place]   LAPAROSCOPIC VAGINAL HYSTERECTOMY WITH SALPINGECTOMY Bilateral 05/13/2021   Procedure: LAPAROSCOPIC ASSISTED VAGINAL HYSTERECTOMY WITH SALPINGECTOMY;  Surgeon: Rubie Maid, MD;  Location: [facility] ORS;  Service: Gynecology;  Laterality: Bilateral;   TUBAL LIGATION      Medications:  Current Outpatient Medications on File  Prior to Visit  Medication Sig   COLLAGEN PO Take 1 tablet by mouth daily.   Famotidine (PEPCID AC PO) Take 1 tablet by mouth as needed.   ibuprofen (ADVIL) 600 MG tablet Take 1 tablet (600 mg total) by mouth every 6 (six) hours as needed.   Multiple Vitamin (MULTIVITAMIN) capsule Take 1 capsule by mouth daily.   Omega-3 Fatty Acids (FISH OIL) 1000 MG CAPS Take 1 capsule by mouth daily.   Probiotic Product (PROBIOTIC PO) Take 1 tablet by mouth daily.   triamcinolone cream (KENALOG) 0.1 % Apply 1 Application topically 2 (two) times daily.   vitamin B-12 (CYANOCOBALAMIN) 500 MCG tablet Take 500 mcg by mouth daily.   Vitamin D, Cholecalciferol, 10 MCG (400 UNIT) CAPS Take 400 Units by mouth daily.   No current facility-administered medications on file prior to visit.    Allergies:  No Known Allergies  Social History:  Social History   Socioeconomic History   Marital status: Married    Spouse name: Not on file   Number of children: Not on file   Years of education: Not on file   Highest education level: Not on file  Occupational History   Not on file  Tobacco Use   Smoking status: Former    Packs/day: 0.50    Years: 18.00  Total pack years: 9.00    Types: Cigars, Cigarettes   Smokeless tobacco: Never  Vaping Use   Vaping Use: Every day  Substance and Sexual Activity   Alcohol use: Yes    Comment: occasional   Drug use: Yes    Types: Marijuana   Sexual activity: Yes    Birth control/protection: Surgical  Other Topics Concern   Not on file  Social History Narrative   Lives at home with husband and her 3 kids.  Works in Engineer, technical sales at  [employer].   Exercise - goes to gym 2 days per week.   04/2021   Social Determinants of Health   Financial Resource Strain: Not on file  Food Insecurity: Not on file  Transportation Needs: Not on file  Physical Activity: Not on file  Stress: Not on file  Social Connections: Not on file  Intimate Partner Violence: Not on file   Social History    Tobacco Use  Smoking Status Former   Packs/day: 0.50   Years: 18.00   Total pack years: 9.00   Types: Cigars, Cigarettes  Smokeless Tobacco Never   Social History   Substance and Sexual Activity  Alcohol Use Yes   Comment: occasional    Family History:  Family History  Problem Relation Age of Onset   Hypertension Mother    Cancer Mother        uterine   Other Father        unknown   Hypertension Brother    Hyperlipidemia Brother    Diabetes Brother    Hyperlipidemia Brother    Hypertension Brother    Cancer Maternal Aunt        breast   Cancer Maternal Uncle        lung   Stroke Maternal Grandfather    Heart disease Maternal Grandfather    Hypertension Other    Diabetes Other     Past medical history, surgical history, medications, allergies, family history and social history reviewed with patient today and changes made to appropriate areas of the chart.   ROS All other ROS negative except what is listed above and in the HPI.      Objective:    LMP 04/30/2021 (Within Days) Comment: Negative Test (05/13/2021)  Wt Readings from Last 3 Encounters:  02/11/22 202 lb 1.6 oz (91.7 kg)  10/28/21 180 lb (81.6 kg)  06/25/21 197 lb (89.4 kg)    Physical Exam  Results for orders placed or performed during the hospital encounter of 10/28/21  Resp Panel by RT-PCR (Flu A&B, Covid) Anterior Nasal Swab   Specimen: Anterior Nasal Swab  Result Value Ref Range   SARS Coronavirus 2 by RT PCR NEGATIVE NEGATIVE   Influenza A by PCR NEGATIVE NEGATIVE   Influenza B by PCR NEGATIVE NEGATIVE  CBC with Differential  Result Value Ref Range   WBC 10.9 (H) 4.0 - 10.5 K/uL   RBC 4.94 3.87 - 5.11 MIL/uL   Hemoglobin 13.6 12.0 - 15.0 g/dL   HCT 41.1 36.0 - 46.0 %   MCV 83.2 80.0 - 100.0 fL   MCH 27.5 26.0 - 34.0 pg   MCHC 33.1 30.0 - 36.0 g/dL   RDW 14.2 11.5 - 15.5 %   Platelets 316 150 - 400 K/uL   nRBC 0.0 0.0 - 0.2 %   Neutrophils Relative % 65 %   Neutro Abs 7.1 1.7 -  7.7 K/uL   Lymphocytes Relative 25 %   Lymphs Abs 2.7 0.7 -  4.0 K/uL   Monocytes Relative 8 %   Monocytes Absolute 0.9 0.1 - 1.0 K/uL   Eosinophils Relative 1 %   Eosinophils Absolute 0.1 0.0 - 0.5 K/uL   Basophils Relative 1 %   Basophils Absolute 0.1 0.0 - 0.1 K/uL   Immature Granulocytes 0 %   Abs Immature Granulocytes 0.03 0.00 - 0.07 K/uL  Comprehensive metabolic panel  Result Value Ref Range   Sodium 136 135 - 145 mmol/L   Potassium 3.8 3.5 - 5.1 mmol/L   Chloride 106 98 - 111 mmol/L   CO2 24 22 - 32 mmol/L   Glucose, Bld 106 (H) 70 - 99 mg/dL   BUN 14 6 - 20 mg/dL   Creatinine, Ser 0.77 0.44 - 1.00 mg/dL   Calcium 9.5 8.9 - 10.3 mg/dL   Total Protein 7.7 6.5 - 8.1 g/dL   Albumin 4.1 3.5 - 5.0 g/dL   AST 18 15 - 41 U/L   ALT 13 0 - 44 U/L   Alkaline Phosphatase 60 38 - 126 U/L   Total Bilirubin 0.6 0.3 - 1.2 mg/dL   GFR, Estimated >60 >60 mL/min   Anion gap 6 5 - 15      Assessment & Plan:   Problem List Items Addressed This Visit   None    Follow up plan: No follow-ups on file.   LABORATORY TESTING:  - Pap smear: {Blank [id number] done","not applicable","up to date","done elsewhere"}  IMMUNIZATIONS:   - Tdap: Tetanus vaccination status reviewed: {tetanus status:315746}. - Influenza: {Blank single:19197::"Up to date","Administered today","Postponed to flu season","Refused","Given elsewhere"} - Pneumovax: {Blank single:19197::"Up to date","Administered today","Not applicable","Refused","Given elsewhere"} - Prevnar: {Blank single:19197::"Up to date","Administered today","Not applicable","Refused","Given elsewhere"} - COVID: {Blank single:19197::"Up to date","Administered today","Not applicable","Refused","Given elsewhere"} - HPV: {Blank single:19197::"Up to date","Administered today","Not applicable","Refused","Given elsewhere"} - Shingrix vaccine: {Blank single:19197::"Up to date","Administered today","Not applicable","Refused","Given  elsewhere"}  SCREENING: -Mammogram: {Blank single:19197::"Up to date","Ordered today","Not applicable","Refused","Done elsewhere"}  - Colonoscopy: {Blank single:19197::"Up to date","Ordered today","Not applicable","Refused","Done elsewhere"}  - Bone Density: {Blank single:19197::"Up to date","Ordered today","Not applicable","Refused","Done elsewhere"}  -Hearing Test: {Blank single:19197::"Up to date","Ordered today","Not applicable","Refused","Done elsewhere"}  -Spirometry: {Blank single:19197::"Up to date","Ordered today","Not applicable","Refused","Done elsewhere"}   PATIENT COUNSELING:   Advised to take 1 mg of folate supplement per day if capable of pregnancy.   Sexuality: Discussed sexually transmitted diseases, partner selection, use of condoms, avoidance of unintended pregnancy  and contraceptive alternatives.   Advised to avoid cigarette smoking.  I discussed with the patient that most people either abstain from alcohol or drink within safe limits (<=14/week and <=4 drinks/occasion for males, <=7/weeks and <= 3 drinks/occasion for females) and that the risk for alcohol disorders and other health effects rises proportionally with the number of drinks per week and how often a drinker exceeds daily limits.  Discussed cessation/primary prevention of drug use and availability of treatment for abuse.   Diet: Encouraged to adjust caloric intake to maintain  or achieve ideal body weight, to reduce intake of dietary saturated fat and total fat, to limit sodium intake by avoiding high sodium foods and not adding table salt, and to maintain adequate dietary potassium and calcium preferably from fresh fruits, vegetables, and low-fat dairy products.    stressed the importance of regular exercise  Injury prevention: Discussed safety belts, safety helmets, smoke detector, smoking near bedding or upholstery.   Dental health: Discussed importance of regular tooth brushing, flossing, and dental  visits.    NEXT PREVENTATIVE PHYSICAL DUE IN 1 YEAR. No follow-ups on file.

## 2022-03-19 ENCOUNTER — Encounter: Payer: 59 | Admitting: Nurse Practitioner

## 2022-04-22 ENCOUNTER — Encounter: Payer: 59 | Admitting: Nurse Practitioner

## 2022-04-22 NOTE — Progress Notes (Unsigned)
LMP 04/30/2021 (Within Days) Comment: Negative Test (05/13/2021)   Subjective:    Patient ID: Patricia Hayes, female    DOB: 09-05-1985, 37 y.o.   MRN: [id number]  HPI: Patricia Hayes is a 37 y.o. female presenting on 04/23/2022 for comprehensive medical examination. Current medical complaints include:{Blank single:19197::"none","***"}  She currently lives with: Menopausal Symptoms: {Blank single:19197::"yes","no"}  ANEMIA Anemia status: {Blank single:19197::"controlled","uncontrolled","better","worse","exacerbated","stable"} Etiology of anemia: Duration of anemia treatment:  Compliance with treatment: {Blank single:19197::"excellent compliance","good compliance","fair compliance","poor compliance"} Iron supplementation side effects: {Blank single:19197::"yes","no"} Severity of anemia: {Blank single:19197::"mild","moderate","severe"} Fatigue: {Blank single:19197::"yes","no"} Decreased exercise tolerance: {Blank single:19197::"yes","no"}  Dyspnea on exertion: {Blank single:19197::"yes","no"} Palpitations: {Blank single:19197::"yes","no"} Bleeding: {Blank single:19197::"yes","no"} Pica: {Blank single:19197::"yes","no"}  MOOD  Depression Screen done today and results listed below:     02/11/2022    8:59 AM 04/17/2021    8:45 AM 12/14/2018   12:21 PM  Depression screen PHQ 2/9  Decreased Interest 0 0 0  Down, Depressed, Hopeless 1 0 0  PHQ - 2 Score 1 0 0  Altered sleeping 0    Tired, decreased energy 1    Change in appetite 0    Feeling bad or failure about yourself  0    Trouble concentrating 0    Moving slowly or fidgety/restless 0    Suicidal thoughts 0    PHQ-9 Score 2    Difficult doing work/chores Not difficult at all      The patient {has/does not have:19849} a history of falls. I {did/did not:19850} complete a risk assessment for falls. A plan of care for falls {was/was not:19852} documented.   Past Medical History:  Past Medical History:   Diagnosis Date   Anemia    Anxiety    in past   Depression    age 72yo   Eczema    GERD (gastroesophageal reflux disease)    Hiatal hernia    History of urinary tract infection    Migraine    Smoker    Uterine cyst    Wears contact lenses    Wears glasses     Surgical History:  Past Surgical History:  Procedure Laterality Date   ESOPHAGOGASTRODUODENOSCOPY  2014   hiatal hernia; Dr. Elnoria Howard   LAPAROSCOPIC VAGINAL HYSTERECTOMY WITH SALPINGECTOMY Bilateral 05/13/2021   Procedure: LAPAROSCOPIC ASSISTED VAGINAL HYSTERECTOMY WITH SALPINGECTOMY;  Surgeon: Hildred Laser, MD;  Location: [facility] ORS;  Service: Gynecology;  Laterality: Bilateral;   TUBAL LIGATION      Medications:  Current Outpatient Medications on File Prior to Visit  Medication Sig   COLLAGEN PO Take 1 tablet by mouth daily.   Famotidine (PEPCID AC PO) Take 1 tablet by mouth as needed.   ibuprofen (ADVIL) 600 MG tablet Take 1 tablet (600 mg total) by mouth every 6 (six) hours as needed.   Multiple Vitamin (MULTIVITAMIN) capsule Take 1 capsule by mouth daily.   Omega-3 Fatty Acids (FISH OIL) 1000 MG CAPS Take 1 capsule by mouth daily.   Probiotic Product (PROBIOTIC PO) Take 1 tablet by mouth daily.   triamcinolone cream (KENALOG) 0.1 % Apply 1 Application topically 2 (two) times daily.   vitamin B-12 (CYANOCOBALAMIN) 500 MCG tablet Take 500 mcg by mouth daily.   Vitamin D, Cholecalciferol, 10 MCG (400 UNIT) CAPS Take 400 Units by mouth daily.   No current facility-administered medications on file prior to visit.    Allergies:  No Known Allergies  Social History:  Social History   Socioeconomic History   Marital status: Married  Spouse name: Not on file   Number of children: Not on file   Years of education: Not on file   Highest education level: Not on file  Occupational History   Not on file  Tobacco Use   Smoking status: Former    Packs/day: 0.50    Years: 18.00    Additional pack years: 0.00     Total pack years: 9.00    Types: Cigars, Cigarettes   Smokeless tobacco: Never  Vaping Use   Vaping Use: Every day  Substance and Sexual Activity   Alcohol use: Yes    Comment: occasional   Drug use: Yes    Types: Marijuana   Sexual activity: Yes    Birth control/protection: Surgical  Other Topics Concern   Not on file  Social History Narrative   Lives at home with husband and her 3 kids.  Works in Consulting civil engineerT at  [employer]   Exercise - goes to gym 2 days per week.   04/2021   Social Determinants of Health   Financial Resource Strain: Not on file  Food Insecurity: Not on file  Transportation Needs: Not on file  Physical Activity: Not on file  Stress: Not on file  Social Connections: Not on file  Intimate Partner Violence: Not on file   Social History   Tobacco Use  Smoking Status Former   Packs/day: 0.50   Years: 18.00   Additional pack years: 0.00   Total pack years: 9.00   Types: Cigars, Cigarettes  Smokeless Tobacco Never   Social History   Substance and Sexual Activity  Alcohol Use Yes   Comment: occasional    Family History:  Family History  Problem Relation Age of Onset   Hypertension Mother    Cancer Mother        uterine   Other Father        unknown   Hypertension Brother    Hyperlipidemia Brother    Diabetes Brother    Hyperlipidemia Brother    Hypertension Brother    Cancer Maternal Aunt        breast   Cancer Maternal Uncle        lung   Stroke Maternal Grandfather    Heart disease Maternal Grandfather    Hypertension Other    Diabetes Other     Past medical history, surgical history, medications, allergies, family history and social history reviewed with patient today and changes made to appropriate areas of the chart.   ROS All other ROS negative except what is listed above and in the HPI.      Objective:    LMP 04/30/2021 (Within Days) Comment: Negative Test (05/13/2021)  Wt Readings from Last 3 Encounters:  02/11/22 202 lb 1.6 oz (91.7  kg)  10/28/21 180 lb (81.6 kg)  06/25/21 197 lb (89.4 kg)    Physical Exam  Results for orders placed or performed during the hospital encounter of 10/28/21  Resp Panel by RT-PCR (Flu A&B, Covid) Anterior Nasal Swab   Specimen: Anterior Nasal Swab  Result Value Ref Range   SARS Coronavirus 2 by RT PCR NEGATIVE NEGATIVE   Influenza A by PCR NEGATIVE NEGATIVE   Influenza B by PCR NEGATIVE NEGATIVE  CBC with Differential  Result Value Ref Range   WBC 10.9 (H) 4.0 - 10.5 K/uL   RBC 4.94 3.87 - 5.11 MIL/uL   Hemoglobin 13.6 12.0 - 15.0 g/dL   HCT 11.941.1 14.736.0 - 82.946.0 %   MCV 83.2 80.0 -  100.0 fL   MCH 27.5 26.0 - 34.0 pg   MCHC 33.1 30.0 - 36.0 g/dL   RDW 17.4 94.4 - 96.7 %   Platelets 316 150 - 400 K/uL   nRBC 0.0 0.0 - 0.2 %   Neutrophils Relative % 65 %   Neutro Abs 7.1 1.7 - 7.7 K/uL   Lymphocytes Relative 25 %   Lymphs Abs 2.7 0.7 - 4.0 K/uL   Monocytes Relative 8 %   Monocytes Absolute 0.9 0.1 - 1.0 K/uL   Eosinophils Relative 1 %   Eosinophils Absolute 0.1 0.0 - 0.5 K/uL   Basophils Relative 1 %   Basophils Absolute 0.1 0.0 - 0.1 K/uL   Immature Granulocytes 0 %   Abs Immature Granulocytes 0.03 0.00 - 0.07 K/uL  Comprehensive metabolic panel  Result Value Ref Range   Sodium 136 135 - 145 mmol/L   Potassium 3.8 3.5 - 5.1 mmol/L   Chloride 106 98 - 111 mmol/L   CO2 24 22 - 32 mmol/L   Glucose, Bld 106 (H) 70 - 99 mg/dL   BUN 14 6 - 20 mg/dL   Creatinine, Ser 5.91 0.44 - 1.00 mg/dL   Calcium 9.5 8.9 - 63.8 mg/dL   Total Protein 7.7 6.5 - 8.1 g/dL   Albumin 4.1 3.5 - 5.0 g/dL   AST 18 15 - 41 U/L   ALT 13 0 - 44 U/L   Alkaline Phosphatase 60 38 - 126 U/L   Total Bilirubin 0.6 0.3 - 1.2 mg/dL   GFR, Estimated >46 >65 mL/min   Anion gap 6 5 - 15      Assessment & Plan:   Problem List Items Addressed This Visit       Other   Iron deficiency anemia - Primary     Follow up plan: No follow-ups on file.   LABORATORY TESTING:  - Pap smear: {Blank  single:19197::"pap done","not applicable","up to date","done elsewhere"}  IMMUNIZATIONS:   - Tdap: Tetanus vaccination status reviewed: {tetanus status:315746}. - Influenza: {Blank single:19197::"Up to date","Administered today","Postponed to flu season","Refused","Given elsewhere"} - Pneumovax: {Blank single:19197::"Up to date","Administered today","Not applicable","Refused","Given elsewhere"} - Prevnar: {Blank single:19197::"Up to date","Administered today","Not applicable","Refused","Given elsewhere"} - COVID: {Blank single:19197::"Up to date","Administered today","Not applicable","Refused","Given elsewhere"} - HPV: {Blank single:19197::"Up to date","Administered today","Not applicable","Refused","Given elsewhere"} - Shingrix vaccine: {Blank single:19197::"Up to date","Administered today","Not applicable","Refused","Given elsewhere"}  SCREENING: -Mammogram: {Blank single:19197::"Up to date","Ordered today","Not applicable","Refused","Done elsewhere"}  - Colonoscopy: {Blank single:19197::"Up to date","Ordered today","Not applicable","Refused","Done elsewhere"}  - Bone Density: {Blank single:19197::"Up to date","Ordered today","Not applicable","Refused","Done elsewhere"}  -Hearing Test: {Blank single:19197::"Up to date","Ordered today","Not applicable","Refused","Done elsewhere"}  -Spirometry: {Blank single:19197::"Up to date","Ordered today","Not applicable","Refused","Done elsewhere"}   PATIENT COUNSELING:   Advised to take 1 mg of folate supplement per day if capable of pregnancy.   Sexuality: Discussed sexually transmitted diseases, partner selection, use of condoms, avoidance of unintended pregnancy  and contraceptive alternatives.   Advised to avoid cigarette smoking.  I discussed with the patient that most people either abstain from alcohol or drink within safe limits (<=14/week and <=4 drinks/occasion for males, <=7/weeks and <= 3 drinks/occasion for females) and that the risk for  alcohol disorders and other health effects rises proportionally with the number of drinks per week and how often a drinker exceeds daily limits.  Discussed cessation/primary prevention of drug use and availability of treatment for abuse.   Diet: Encouraged to adjust caloric intake to maintain  or achieve ideal body weight, to reduce intake of dietary saturated fat and total fat, to limit  sodium intake by avoiding high sodium foods and not adding table salt, and to maintain adequate dietary potassium and calcium preferably from fresh fruits, vegetables, and low-fat dairy products.    stressed the importance of regular exercise  Injury prevention: Discussed safety belts, safety helmets, smoke detector, smoking near bedding or upholstery.   Dental health: Discussed importance of regular tooth brushing, flossing, and dental visits.    NEXT PREVENTATIVE PHYSICAL DUE IN 1 YEAR. No follow-ups on file.

## 2022-04-23 ENCOUNTER — Ambulatory Visit (INDEPENDENT_AMBULATORY_CARE_PROVIDER_SITE_OTHER): Payer: 59 | Admitting: Nurse Practitioner

## 2022-04-23 ENCOUNTER — Encounter: Payer: Self-pay | Admitting: Nurse Practitioner

## 2022-04-23 VITALS — BP 105/67 | HR 69 | Temp 98.3°F | Ht 68.19 in | Wt 194.2 lb

## 2022-04-23 DIAGNOSIS — Z0000 Encounter for general adult medical examination without abnormal findings: Secondary | ICD-10-CM | POA: Diagnosis not present

## 2022-04-23 DIAGNOSIS — D500 Iron deficiency anemia secondary to blood loss (chronic): Secondary | ICD-10-CM | POA: Diagnosis not present

## 2022-04-23 DIAGNOSIS — Z136 Encounter for screening for cardiovascular disorders: Secondary | ICD-10-CM

## 2022-04-23 LAB — URINALYSIS, ROUTINE W REFLEX MICROSCOPIC
Bilirubin, UA: NEGATIVE
Glucose, UA: NEGATIVE
Ketones, UA: NEGATIVE
Leukocytes,UA: NEGATIVE
Nitrite, UA: NEGATIVE
Protein,UA: NEGATIVE
RBC, UA: NEGATIVE
Specific Gravity, UA: 1.025 (ref 1.005–1.030)
Urobilinogen, Ur: 0.2 mg/dL (ref 0.2–1.0)
pH, UA: 5 (ref 5.0–7.5)

## 2022-04-24 LAB — CBC WITH DIFFERENTIAL/PLATELET
Basophils Absolute: 0.1 10*3/uL (ref 0.0–0.2)
Basos: 1 %
EOS (ABSOLUTE): 0.1 10*3/uL (ref 0.0–0.4)
Eos: 1 %
Hematocrit: 40.7 % (ref 34.0–46.6)
Hemoglobin: 13.4 g/dL (ref 11.1–15.9)
Immature Grans (Abs): 0 10*3/uL (ref 0.0–0.1)
Immature Granulocytes: 0 %
Lymphocytes Absolute: 1.9 10*3/uL (ref 0.7–3.1)
Lymphs: 30 %
MCH: 29.3 pg (ref 26.6–33.0)
MCHC: 32.9 g/dL (ref 31.5–35.7)
MCV: 89 fL (ref 79–97)
Monocytes Absolute: 0.5 10*3/uL (ref 0.1–0.9)
Monocytes: 7 %
Neutrophils Absolute: 3.9 10*3/uL (ref 1.4–7.0)
Neutrophils: 61 %
Platelets: 256 10*3/uL (ref 150–450)
RBC: 4.57 x10E6/uL (ref 3.77–5.28)
RDW: 12.8 % (ref 11.7–15.4)
WBC: 6.4 10*3/uL (ref 3.4–10.8)

## 2022-04-24 LAB — LIPID PANEL
Chol/HDL Ratio: 2.9 ratio (ref 0.0–4.4)
Cholesterol, Total: 155 mg/dL (ref 100–199)
HDL: 54 mg/dL (ref >39)
LDL Chol Calc (NIH): 88 mg/dL (ref 0–99)
Triglycerides: 66 mg/dL (ref 0–149)
VLDL Cholesterol Cal: 13 mg/dL (ref 5–40)

## 2022-04-24 LAB — COMPREHENSIVE METABOLIC PANEL
ALT: 12 IU/L (ref 0–32)
AST: 11 IU/L (ref 0–40)
Albumin/Globulin Ratio: 1.7 (ref 1.2–2.2)
Albumin: 4.1 g/dL (ref 3.9–4.9)
Alkaline Phosphatase: 55 IU/L (ref 44–121)
BUN/Creatinine Ratio: 18 (ref 9–23)
BUN: 13 mg/dL (ref 6–20)
Bilirubin Total: 0.3 mg/dL (ref 0.0–1.2)
CO2: 21 mmol/L (ref 20–29)
Calcium: 9 mg/dL (ref 8.7–10.2)
Chloride: 105 mmol/L (ref 96–106)
Creatinine, Ser: 0.73 mg/dL (ref 0.57–1.00)
Globulin, Total: 2.4 g/dL (ref 1.5–4.5)
Glucose: 103 mg/dL — ABNORMAL HIGH (ref 70–99)
Potassium: 4.1 mmol/L (ref 3.5–5.2)
Sodium: 140 mmol/L (ref 134–144)
Total Protein: 6.5 g/dL (ref 6.0–8.5)
eGFR: 108 mL/min/{1.73_m2} (ref >59)

## 2022-04-24 LAB — TSH: TSH: 1.41 u[IU]/mL (ref 0.450–4.500)

## 2022-04-24 NOTE — Progress Notes (Signed)
Hi Fama. It was nice to see you yesterday.  Your lab work looks good.  No concerns at this time. Continue with your current medication regimen.  Follow up as discussed.  Please let me know if you have any questions.

## 2022-11-26 IMAGING — CT CT NECK W/ CM
3 of 4 series · 13 of 33 positions shown, 16 images · IV contrast (agent unspecified)
Comparison: None.

CLINICAL DATA: Left neck mass/asymmetry, possibly involving
cartilage.

EXAM:
CT NECK WITH CONTRAST
TECHNIQUE: Multidetector CT imaging of the neck was performed using the
standard protocol following the bolus administration of intravenous
contrast.

[Series 5: neck 2.0 st · sagittal · 0.46mm/px · 5 of 102 slices shown, 6 images (1 of 2)]
[im 34/102  bone]
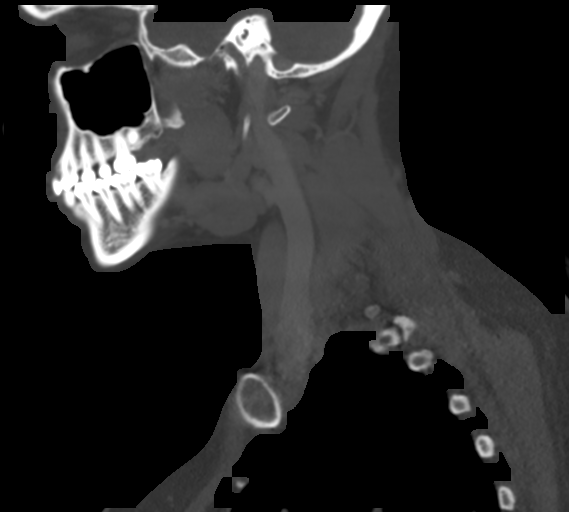
[im 43/102  bone]
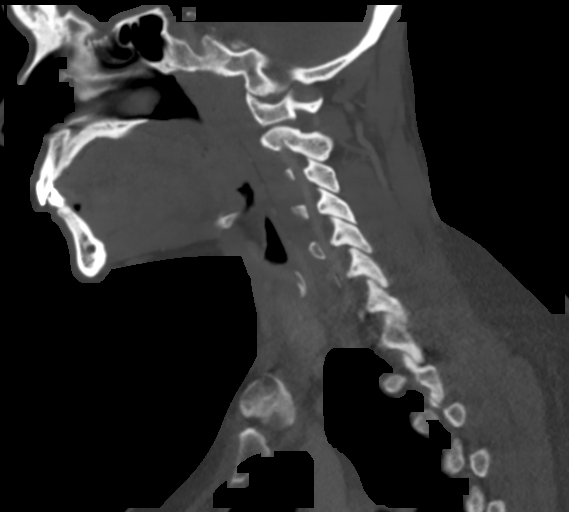
[im 51/102  soft-tissue]
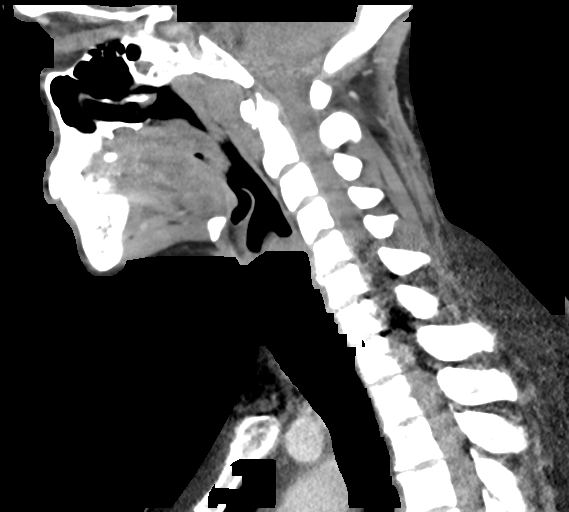
[im 51/102  bone]
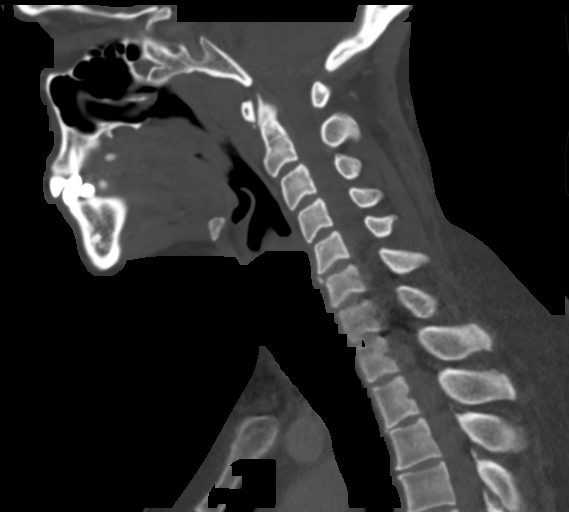
[im 59/102  bone]
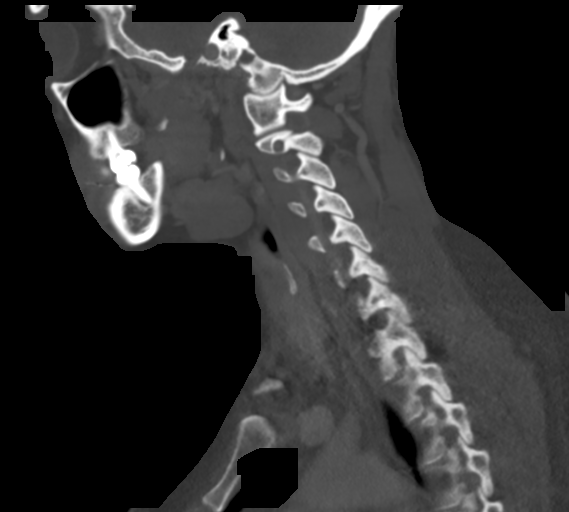
[im 68/102  bone]
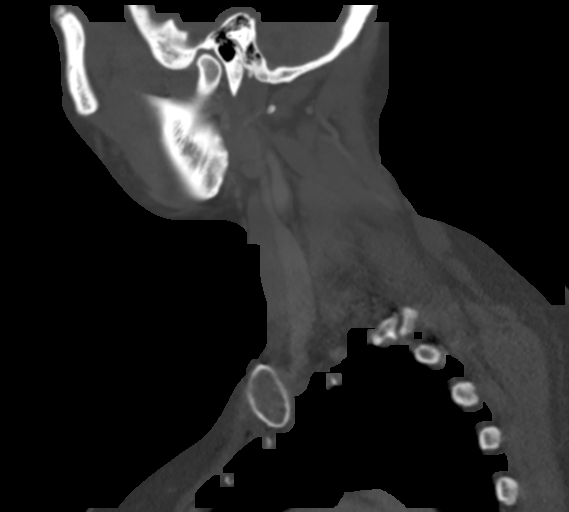

[Series 6: neck 2.0 st · coronal · 0.43mm/px · 3 of 102 slices shown (2 of 2)]
[im 21/102  bone]
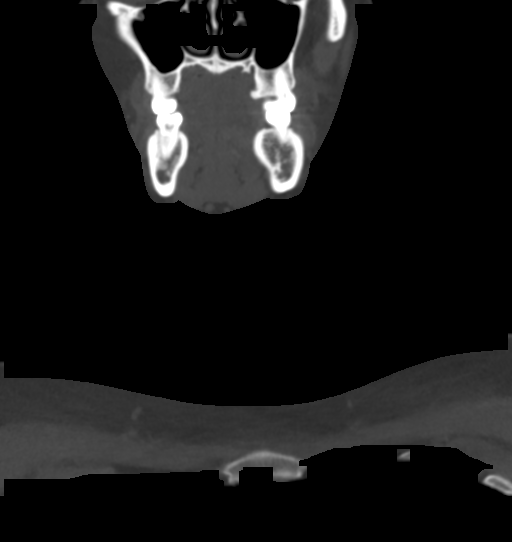
[im 41/102  bone]
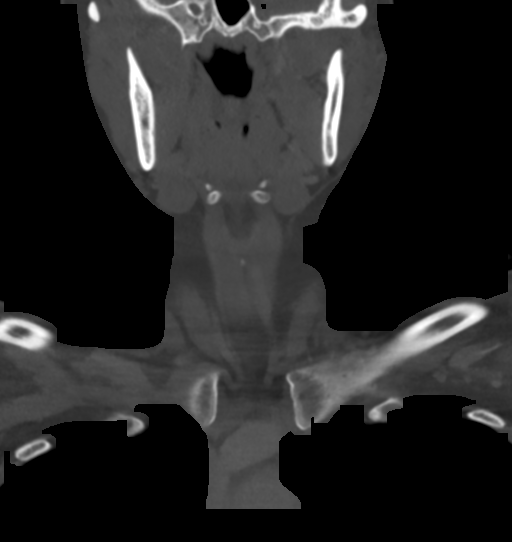
[im 61/102  bone]
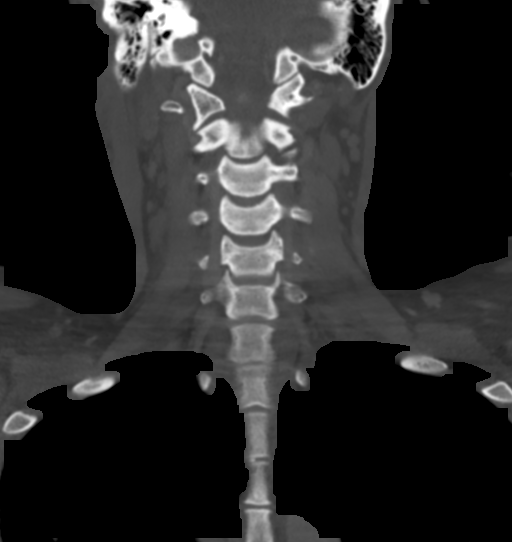

[Series 7: neck 2.0 st orthogonal · axial · 0.39mm/px · z∈[-312,-160]mm · 5 of 118 slices shown, 7 images]
[im 20/118  soft-tissue]
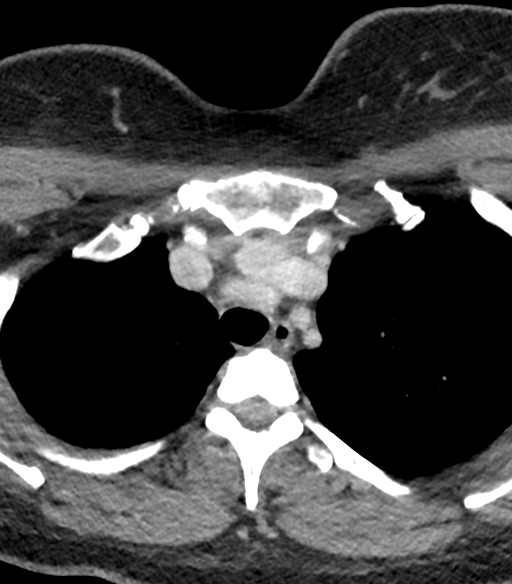
[im 20/118  bone]
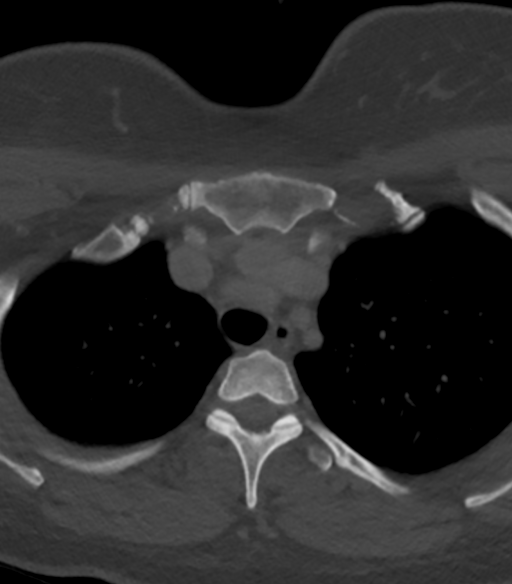
[im 40/118  bone]
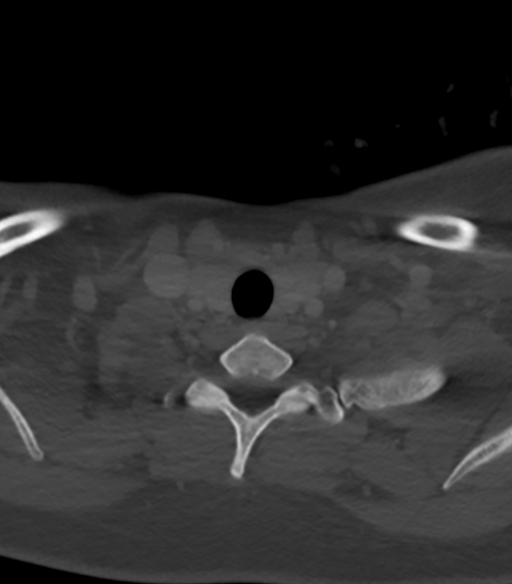
[im 59/118  bone]
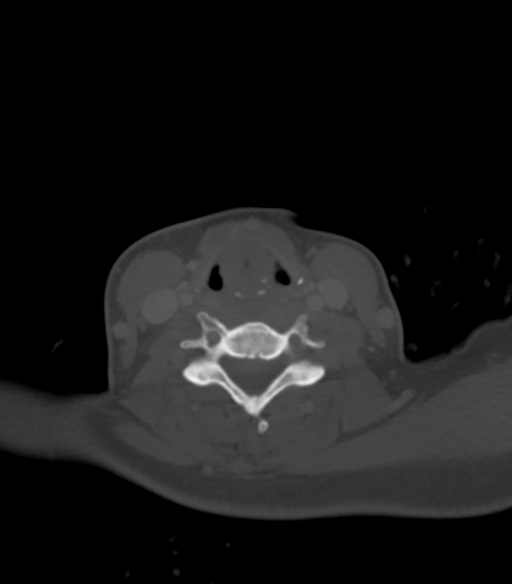
[im 79/118  bone]
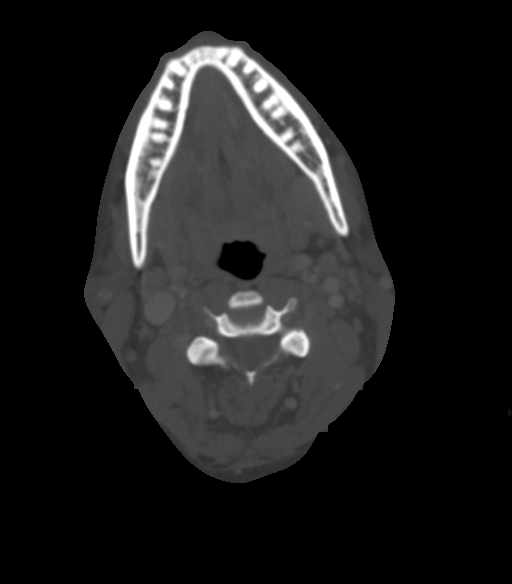
[im 98/118  soft-tissue]
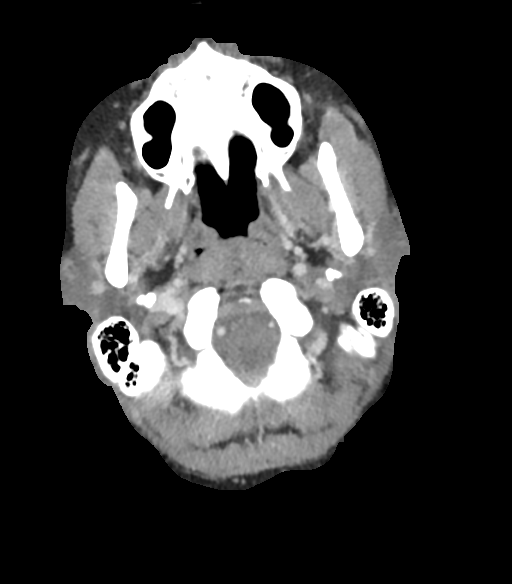
[im 98/118  bone]
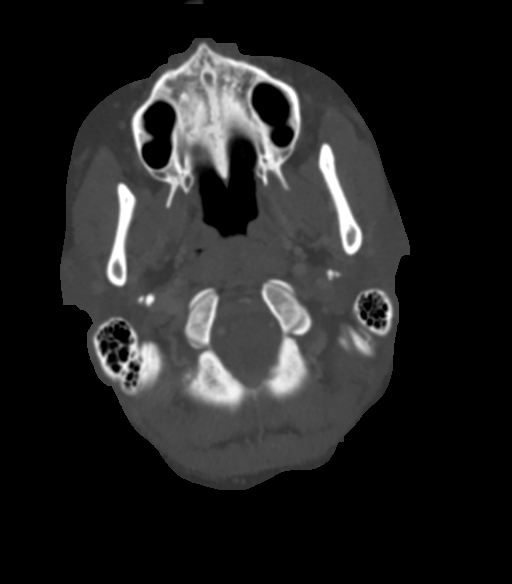

[13 of 33 positions shown; findings below may reference images not displayed]

RADIATION DOSE REDUCTION: This exam was performed according to the
departmental dose-optimization program which includes automated
exposure control, adjustment of the mA and/or kV according to
patient size and/or use of iterative reconstruction technique.

CONTRAST:  75mL NIL9Z2-C77 IOPAMIDOL (NIL9Z2-C77) INJECTION 61%
FINDINGS: Pharynx and larynx: No evidence of mass or swelling. Widely patent
airway. No fluid collection or inflammatory changes in the
parapharyngeal or retropharyngeal spaces.

Salivary glands: No inflammation, mass, or stone.

Thyroid: Unremarkable.

Lymph nodes: No enlarged or suspicious anterior cervical lymph nodes
with particular attention to the left upper neck in the area of
clinical concern. Small left level V lymph node which measures 5 mm
in short axis and is asymmetrically prominent compared to the
contralateral side but is remote from the area of clinical concern
and most likely benign/reactive.

Vascular: Major vascular structures of the neck are grossly patent.

Limited intracranial: Unremarkable.

Visualized orbits: Unremarkable.

Mastoids and visualized paranasal sinuses: Clear.

Skeleton: No acute osseous abnormality or suspicious osseous lesion.
No abnormality identified of the thyroid or cricoid cartilage.

Upper chest: Clear lung apices.

Other: None.
IMPRESSION: No acute abnormality or mass identified in the area of clinical
concern.

## 2023-07-31 ENCOUNTER — Emergency Department (HOSPITAL_COMMUNITY)
Admission: EM | Admit: 2023-07-31 | Discharge: 2023-08-01 | Disposition: A | Discharge status: Home / Self Care | Attending: Emergency Medicine | Admitting: Emergency Medicine

## 2023-07-31 ENCOUNTER — Encounter (HOSPITAL_COMMUNITY): Payer: Self-pay | Admitting: Emergency Medicine

## 2023-07-31 ENCOUNTER — Other Ambulatory Visit: Payer: Self-pay

## 2023-07-31 DIAGNOSIS — G43801 Other migraine, not intractable, with status migrainosus: Secondary | ICD-10-CM | POA: Insufficient documentation

## 2023-07-31 LAB — CBC WITH DIFFERENTIAL/PLATELET
Abs Immature Granulocytes: 0.03 K/uL (ref 0.00–0.07)
Basophils Absolute: 0.1 K/uL (ref 0.0–0.1)
Basophils Relative: 1 %
Eosinophils Absolute: 0.1 K/uL (ref 0.0–0.5)
Eosinophils Relative: 2 %
HCT: 40.5 % (ref 36.0–46.0)
Hemoglobin: 13.1 g/dL (ref 12.0–15.0)
Immature Granulocytes: 0 %
Lymphocytes Relative: 31 %
Lymphs Abs: 2.5 K/uL (ref 0.7–4.0)
MCH: 29.1 pg (ref 26.0–34.0)
MCHC: 32.3 g/dL (ref 30.0–36.0)
MCV: 90 fL (ref 80.0–100.0)
Monocytes Absolute: 0.7 K/uL (ref 0.1–1.0)
Monocytes Relative: 9 %
Neutro Abs: 4.6 K/uL (ref 1.7–7.7)
Neutrophils Relative %: 57 %
Platelets: 249 K/uL (ref 150–400)
RBC: 4.5 MIL/uL (ref 3.87–5.11)
RDW: 12.9 % (ref 11.5–15.5)
WBC: 8.1 K/uL (ref 4.0–10.5)
nRBC: 0 % (ref 0.0–0.2)

## 2023-07-31 LAB — COMPREHENSIVE METABOLIC PANEL WITH GFR
ALT: 13 U/L (ref 0–44)
AST: 17 U/L (ref 15–41)
Albumin: 3.7 g/dL (ref 3.5–5.0)
Alkaline Phosphatase: 50 U/L (ref 38–126)
Anion gap: 9 (ref 5–15)
BUN: 12 mg/dL (ref 6–20)
CO2: 23 mmol/L (ref 22–32)
Calcium: 8.9 mg/dL (ref 8.9–10.3)
Chloride: 106 mmol/L (ref 98–111)
Creatinine, Ser: 1.06 mg/dL — ABNORMAL HIGH (ref 0.44–1.00)
GFR, Estimated: >60 mL/min (ref >60)
Glucose, Bld: 102 mg/dL — ABNORMAL HIGH (ref 70–99)
Potassium: 3.8 mmol/L (ref 3.5–5.1)
Sodium: 138 mmol/L (ref 135–145)
Total Bilirubin: 0.5 mg/dL (ref 0.0–1.2)
Total Protein: 6.8 g/dL (ref 6.5–8.1)

## 2023-07-31 MED ORDER — KETOROLAC TROMETHAMINE 30 MG/ML IJ SOLN
15.0000 mg | Freq: Once | INTRAMUSCULAR | Status: AC
  Administered 2023-07-31: 15 mg via INTRAVENOUS
  Filled 2023-07-31: qty 1

## 2023-07-31 MED ORDER — LACTATED RINGERS IV BOLUS
1000.0000 mL | Freq: Once | INTRAVENOUS | Status: AC
  Administered 2023-07-31: 1000 mL via INTRAVENOUS

## 2023-07-31 MED ORDER — METOCLOPRAMIDE HCL 5 MG/ML IJ SOLN
10.0000 mg | Freq: Once | INTRAMUSCULAR | Status: AC
  Administered 2023-07-31: 10 mg via INTRAVENOUS
  Filled 2023-07-31: qty 2

## 2023-07-31 MED ORDER — DIPHENHYDRAMINE HCL 50 MG/ML IJ SOLN
12.5000 mg | Freq: Once | INTRAMUSCULAR | Status: AC
  Administered 2023-07-31: 12.5 mg via INTRAVENOUS
  Filled 2023-07-31: qty 1

## 2023-07-31 NOTE — ED Triage Notes (Signed)
 Pt presents to the ED via POV with complaints of migraine for 12+ hours with pressure in her head and light sensitivity. She notes trying OTC pain meds without any improvement. Hx of migraines. A&Ox4 at this time. Denies urinary sx, abdominal pain, CP, or SOB.

## 2023-07-31 NOTE — ED Notes (Signed)
 Unsuccessful IV attempt x2.

## 2023-08-01 MED ORDER — KETOROLAC TROMETHAMINE 30 MG/ML IJ SOLN
15.0000 mg | Freq: Once | INTRAMUSCULAR | Status: AC
  Administered 2023-08-01: 15 mg via INTRAVENOUS
  Filled 2023-08-01: qty 1

## 2023-08-01 MED ORDER — PROCHLORPERAZINE MALEATE 10 MG PO TABS
10.0000 mg | ORAL_TABLET | Freq: Once | ORAL | Status: AC
  Administered 2023-08-01: 10 mg via ORAL
  Filled 2023-08-01: qty 1

## 2023-08-01 NOTE — ED Notes (Signed)
 Reviewed D/C information with the patient, pt verbalized understanding. No additional concerns at this time.

## 2023-08-01 NOTE — ED Provider Notes (Signed)
 [place] [facility] AT [facility] Provider Note   CSN: [id number] Arrival date & time: 07/31/23  1856     Patient presents with: Migraine   Patricia Hayes is a 38 y.o. female.    Migraine     Patient has a history of migraines depression acid reflux anxiety.  She presents ED with persistent headache.  Patient states she has a history of migraines.  She has been having a migraine ongoing now for 12 hours.  She tried over-the-counter medications including Tylenol  and NSAIDs at home without relief.  Patient is having light sensitivity.  She feels pressure in her temples.  She denies any nausea vomiting.  No fevers or chills.  No focal numbness or weakness  Prior to Admission medications   Medication Sig Start Date End Date Taking? Authorizing Provider  COLLAGEN PO Take 1 tablet by mouth daily.    [provider]  Famotidine  (PEPCID  AC PO) Take 1 tablet by mouth as needed.    [provider]  ibuprofen  (ADVIL ) 600 MG tablet Take 1 tablet (600 mg total) by mouth every 6 (six) hours as needed. 05/13/21   Connell Davies, MD  meloxicam (MOBIC) 15 MG tablet Take 15 mg by mouth daily. 02/19/22   [provider]  Multiple Vitamin (MULTIVITAMIN) capsule Take 1 capsule by mouth daily.    [provider]  Omega-3 Fatty Acids (FISH OIL) 1000 MG CAPS Take 1 capsule by mouth daily.    [provider]  Probiotic Product (PROBIOTIC PO) Take 1 tablet by mouth daily.    [provider]  triamcinolone  cream (KENALOG ) 0.1 % Apply 1 Application topically 2 (two) times daily. 02/11/22   Melvin Pao, NP  vitamin B-12 (CYANOCOBALAMIN) 500 MCG tablet Take 500 mcg by mouth daily.    [provider]  Vitamin D, Cholecalciferol, 10 MCG (400 UNIT) CAPS Take 400 Units by mouth daily.    [provider]    Allergies: Patient has no known allergies.    Review of Systems  Updated Vital Signs BP 105/78    Pulse 75   Temp 99.9 F (37.7 C) (Oral)   Resp 17   Ht 1.753 m (5' 9)   Wt 77.1 kg   LMP 04/30/2021 (Within Days) Comment: Negative Test (05/13/2021)  SpO2 100%   BMI 25.10 kg/m   Physical Exam Vitals and nursing note reviewed.  Constitutional:      Appearance: She is well-developed. She is not diaphoretic.  HENT:     Head: Normocephalic and atraumatic.     Right Ear: External ear normal.     Left Ear: External ear normal.  Eyes:     General: No scleral icterus.       Right eye: No discharge.        Left eye: No discharge.     Conjunctiva/sclera: Conjunctivae normal.  Neck:     Trachea: No tracheal deviation.  Cardiovascular:     Rate and Rhythm: Normal rate and regular rhythm.  Pulmonary:     Effort: Pulmonary effort is normal. No respiratory distress.     Breath sounds: Normal breath sounds. No stridor. No wheezing or rales.  Abdominal:     General: Bowel sounds are normal. There is no distension.     Palpations: Abdomen is soft.     Tenderness: There is no abdominal tenderness. There is no guarding or rebound.  Musculoskeletal:        General: No tenderness or deformity.  Cervical back: Neck supple.  Skin:    General: Skin is warm and dry.     Findings: No rash.  Neurological:     General: No focal deficit present.     Mental Status: She is alert.     Cranial Nerves: No cranial nerve deficit, dysarthria or facial asymmetry.     Sensory: No sensory deficit.     Motor: No abnormal muscle tone or seizure activity.     Coordination: Coordination normal.  Psychiatric:        Mood and Affect: Mood normal.     (all labs ordered are listed, but only abnormal results are displayed) Labs Reviewed  COMPREHENSIVE METABOLIC PANEL WITH GFR - Abnormal; Notable for the following components:      Result Value   Glucose, Bld 102 (*)    Creatinine, Ser 1.06 (*)    All other components within normal limits  CBC WITH DIFFERENTIAL/PLATELET     EKG: None  Radiology: No results found.   Procedures   Medications Ordered in the ED  prochlorperazine  (COMPAZINE ) tablet 10 mg (has no administration in time range)  ketorolac  (TORADOL ) 30 MG/ML injection 15 mg (has no administration in time range)  metoCLOPramide  (REGLAN ) injection 10 mg (10 mg Intravenous Given 07/31/23 2318)  diphenhydrAMINE  (BENADRYL ) injection 12.5 mg (12.5 mg Intravenous Given 07/31/23 2325)  ketorolac  (TORADOL ) 30 MG/ML injection 15 mg (15 mg Intravenous Given 07/31/23 2324)  lactated ringers  bolus 1,000 mL (1,000 mLs Intravenous New Bag/Given 07/31/23 2325)    Clinical Course as of 08/01/23 0021  Sat Aug 01, 2023  0012 Patient states she is still having some head pressure.  Would like an additional dose of medication [JK]  0012 CBC metabolic panel unremarkable [JK]    Clinical Course User Index [JK] Randol Simmonds, MD                                 Medical Decision Making Amount and/or Complexity of Data Reviewed Labs: ordered.  Risk Prescription drug management.   Patient presented to the ED with complaints of migraine headache.  She does have a history of same.  No findings to suggest acute infection.  No focal neurologic deficits.  Patient treated with migraine cocktail.  Still having symptoms, additional dose of medications ordered.  Care turned over to Dr Nettie at shift change.     Final diagnoses:  Other migraine with status migrainosus, not intractable    ED Discharge Orders     None          Randol Simmonds, MD 08/01/23 0021

## 2023-08-02 ENCOUNTER — Other Ambulatory Visit: Payer: Self-pay

## 2023-08-02 ENCOUNTER — Emergency Department (HOSPITAL_COMMUNITY)
Admission: EM | Admit: 2023-08-02 | Discharge: 2023-08-02 | Disposition: A | Discharge status: Home / Self Care | Attending: Emergency Medicine | Admitting: Emergency Medicine

## 2023-08-02 ENCOUNTER — Emergency Department (HOSPITAL_COMMUNITY)

## 2023-08-02 ENCOUNTER — Encounter (HOSPITAL_COMMUNITY): Payer: Self-pay

## 2023-08-02 DIAGNOSIS — R519 Headache, unspecified: Secondary | ICD-10-CM | POA: Diagnosis present

## 2023-08-02 LAB — CBC WITH DIFFERENTIAL/PLATELET
Abs Immature Granulocytes: 0.02 K/uL (ref 0.00–0.07)
Basophils Absolute: 0 K/uL (ref 0.0–0.1)
Basophils Relative: 1 %
Eosinophils Absolute: 0.1 K/uL (ref 0.0–0.5)
Eosinophils Relative: 2 %
HCT: 40.3 % (ref 36.0–46.0)
Hemoglobin: 13.5 g/dL (ref 12.0–15.0)
Immature Granulocytes: 0 %
Lymphocytes Relative: 33 %
Lymphs Abs: 1.9 K/uL (ref 0.7–4.0)
MCH: 29.8 pg (ref 26.0–34.0)
MCHC: 33.5 g/dL (ref 30.0–36.0)
MCV: 89 fL (ref 80.0–100.0)
Monocytes Absolute: 0.8 K/uL (ref 0.1–1.0)
Monocytes Relative: 14 %
Neutro Abs: 2.8 K/uL (ref 1.7–7.7)
Neutrophils Relative %: 50 %
Platelets: 255 K/uL (ref 150–400)
RBC: 4.53 MIL/uL (ref 3.87–5.11)
RDW: 12.4 % (ref 11.5–15.5)
WBC: 5.6 K/uL (ref 4.0–10.5)
nRBC: 0 % (ref 0.0–0.2)

## 2023-08-02 LAB — BASIC METABOLIC PANEL WITH GFR
Anion gap: 6 (ref 5–15)
BUN: 10 mg/dL (ref 6–20)
CO2: 27 mmol/L (ref 22–32)
Calcium: 8.7 mg/dL — ABNORMAL LOW (ref 8.9–10.3)
Chloride: 106 mmol/L (ref 98–111)
Creatinine, Ser: 0.86 mg/dL (ref 0.44–1.00)
GFR, Estimated: >60 mL/min (ref >60)
Glucose, Bld: 83 mg/dL (ref 70–99)
Potassium: 3.6 mmol/L (ref 3.5–5.1)
Sodium: 139 mmol/L (ref 135–145)

## 2023-08-02 LAB — RESP PANEL BY RT-PCR (RSV, FLU A&B, COVID)  RVPGX2
Influenza A by PCR: NEGATIVE
Influenza B by PCR: NEGATIVE
Resp Syncytial Virus by PCR: NEGATIVE
SARS Coronavirus 2 by RT PCR: NEGATIVE

## 2023-08-02 MED ORDER — PROCHLORPERAZINE EDISYLATE 10 MG/2ML IJ SOLN
10.0000 mg | Freq: Once | INTRAMUSCULAR | Status: AC
  Administered 2023-08-02: 10 mg via INTRAVENOUS
  Filled 2023-08-02: qty 2

## 2023-08-02 MED ORDER — DEXAMETHASONE SODIUM PHOSPHATE 10 MG/ML IJ SOLN
10.0000 mg | Freq: Once | INTRAMUSCULAR | Status: AC
  Administered 2023-08-02: 10 mg via INTRAVENOUS
  Filled 2023-08-02: qty 1

## 2023-08-02 MED ORDER — SODIUM CHLORIDE 0.9 % IV BOLUS
1000.0000 mL | Freq: Once | INTRAVENOUS | Status: AC
  Administered 2023-08-02: 1000 mL via INTRAVENOUS

## 2023-08-02 MED ORDER — KETOROLAC TROMETHAMINE 30 MG/ML IJ SOLN
15.0000 mg | Freq: Once | INTRAMUSCULAR | Status: AC
  Administered 2023-08-02: 15 mg via INTRAVENOUS
  Filled 2023-08-02: qty 1

## 2023-08-02 NOTE — ED Provider Notes (Signed)
 [place] [facility] AT [facility] Provider Note   CSN: [id number] Arrival date & time: 08/02/23  1736     Patient presents with: Migraine   Patricia Hayes is a 38 y.o. female.   Patient to ED with symptoms of headache, feeling like she has poor concentration, intermittent positional dizziness and nausea. She reports she was seen in the ED 2 days ago for migraine headache which was treated. Headache did improve but came back with the other symptoms. She is unsure if she has had a fever. No congestion or sore throat. No vomiting. History of migraine headaches that are infrequent.   The history is provided by the patient. No language interpreter was used.       Prior to Admission medications   Medication Sig Start Date End Date Taking? Authorizing Provider  COLLAGEN PO Take 1 tablet by mouth daily.    [provider]  Famotidine  (PEPCID  AC PO) Take 1 tablet by mouth as needed.    [provider]  ibuprofen  (ADVIL ) 600 MG tablet Take 1 tablet (600 mg total) by mouth every 6 (six) hours as needed. 05/13/21   Connell Davies, MD  meloxicam (MOBIC) 15 MG tablet Take 15 mg by mouth daily. 02/19/22   [provider]  Multiple Vitamin (MULTIVITAMIN) capsule Take 1 capsule by mouth daily.    [provider]  Omega-3 Fatty Acids (FISH OIL) 1000 MG CAPS Take 1 capsule by mouth daily.    [provider]  Probiotic Product (PROBIOTIC PO) Take 1 tablet by mouth daily.    [provider]  triamcinolone  cream (KENALOG ) 0.1 % Apply 1 Application topically 2 (two) times daily. 02/11/22   Melvin Pao, NP  vitamin B-12 (CYANOCOBALAMIN) 500 MCG tablet Take 500 mcg by mouth daily.    [provider]  Vitamin D, Cholecalciferol, 10 MCG (400 UNIT) CAPS Take 400 Units by mouth daily.    [provider]    Allergies: Patient has no known allergies.    Review of Systems  Updated Vital Signs BP 120/86    Pulse 99   Temp 97.8 F (36.6 C)   Resp 16   Ht 5' 9 (1.753 m)   Wt 79.4 kg   LMP 04/30/2021 (Within Days) Comment: Negative Test (05/13/2021)  SpO2 99%   BMI 25.84 kg/m   Physical Exam Vitals and nursing note reviewed.  Constitutional:      Appearance: She is well-developed.  HENT:     Head: Normocephalic and atraumatic.     Mouth/Throat:     Mouth: Mucous membranes are moist.  Eyes:     Extraocular Movements: Extraocular movements intact.     Pupils: Pupils are equal, round, and reactive to light.  Cardiovascular:     Rate and Rhythm: Normal rate and regular rhythm.     Heart sounds: No murmur heard. Pulmonary:     Effort: Pulmonary effort is normal.     Breath sounds: Normal breath sounds. No wheezing, rhonchi or rales.  Abdominal:     General: Bowel sounds are normal.     Palpations: Abdomen is soft.     Tenderness: There is no abdominal tenderness. There is no guarding or rebound.  Musculoskeletal:        General: Normal range of motion.     Cervical back: Normal range of motion and neck supple.  Skin:    General: Skin is warm and dry.  Neurological:     General: No focal  deficit present.     Mental Status: She is alert and oriented to person, place, and time.     GCS: GCS eye subscore is 4. GCS verbal subscore is 5. GCS motor subscore is 6.     Cranial Nerves: Cranial nerves 2-12 are intact. No dysarthria or facial asymmetry.     Sensory: Sensation is intact.     Motor: No weakness or pronator drift.     Coordination: Finger-Nose-Finger Test normal.     Gait: Gait normal.     (all labs ordered are listed, but only abnormal results are displayed) Labs Reviewed  BASIC METABOLIC PANEL WITH GFR - Abnormal; Notable for the following components:      Result Value   Calcium  8.7 (*)    All other components within normal limits  RESP PANEL BY RT-PCR (RSV, FLU A&B, COVID)  [id number]  CBC WITH DIFFERENTIAL/PLATELET    EKG: None  Radiology: CT Head Wo  Contrast Result Date: 08/02/2023 CLINICAL DATA:  Headache. EXAM: CT HEAD WITHOUT CONTRAST TECHNIQUE: Contiguous axial images were obtained from the base of the skull through the vertex without intravenous contrast. RADIATION DOSE REDUCTION: This exam was performed according to the departmental dose-optimization program which includes automated exposure control, adjustment of the mA and/or kV according to patient size and/or use of iterative reconstruction technique. COMPARISON:  November 20, 2011 FINDINGS: Brain: No evidence of acute infarction, hemorrhage, hydrocephalus, extra-axial collection or mass lesion/mass effect. Vascular: No hyperdense vessel or unexpected calcification. Skull: Normal. Negative for fracture or focal lesion. Sinuses/Orbits: No acute finding. Other: None. IMPRESSION: No acute intracranial pathology. Electronically Signed   By: Suzen Dials M.D.   On: 08/02/2023 20:04     Procedures   Medications Ordered in the ED  sodium chloride  0.9 % bolus 1,000 mL (1,000 mLs Intravenous New Bag/Given 08/02/23 2054)  ketorolac  (TORADOL ) 30 MG/ML injection 15 mg (15 mg Intravenous Given 08/02/23 2055)  prochlorperazine  (COMPAZINE ) injection 10 mg (10 mg Intravenous Given 08/02/23 2058)  dexamethasone  (DECADRON ) injection 10 mg (10 mg Intravenous Given 08/02/23 2056)    Clinical Course as of 08/02/23 2224  Sun Aug 02, 2023  2012 Patient to ED with headache, dizziness, nausea w/o vomiting, poor concentration. H/O migraines but this is not the same.  [SU]  2220 Head CT negative. Labs unremarkable. Viral panel negative. Pain is improved after headache cocktail. VSS. Offered neurology follow up/referral, patient opts to call her PCP in the morning.  [SU]    Clinical Course User Index [SU] Odell Balls, PA-C                                 Medical Decision Making Amount and/or Complexity of Data Reviewed Labs: ordered. Radiology: ordered.  Risk Prescription drug  management.        Final diagnoses:  Acute nonintractable headache, unspecified headache type    ED Discharge Orders     None          Odell Balls RIGGERS 08/02/23 2224    Randol Simmonds, MD 08/03/23 1059

## 2023-08-02 NOTE — Discharge Instructions (Signed)
 As we discussed, your labs and head CT are reassuring. Please follow up with your doctor for further outpatient evaluation and treatment if symptoms persist or worsen.

## 2023-08-02 NOTE — ED Triage Notes (Addendum)
 Pt came in via POV d/t migraine that she had start on Thursday & no OTC meds was helping. Was seen at [facility] & Tx with migraine cocktail then d/c. Pt reports it is now back & when she stood she will get some pressure in her head & dizziness when she stands plus feeling like she can't keep her thoughts straight when texting her family, not getting the message correct (per pt). Pt also reports having body aches.

## 2023-08-26 ENCOUNTER — Ambulatory Visit: Admitting: Family Medicine

## 2023-09-23 ENCOUNTER — Encounter: Admitting: Nurse Practitioner

## 2023-10-19 ENCOUNTER — Ambulatory Visit: Admitting: Nurse Practitioner

## 2023-10-19 VITALS — BP 108/71 | HR 69 | Temp 98.2°F | Ht 68.2 in | Wt 183.0 lb

## 2023-10-19 DIAGNOSIS — Z0000 Encounter for general adult medical examination without abnormal findings: Secondary | ICD-10-CM | POA: Diagnosis not present

## 2023-10-19 DIAGNOSIS — Z1231 Encounter for screening mammogram for malignant neoplasm of breast: Secondary | ICD-10-CM | POA: Diagnosis not present

## 2023-10-19 DIAGNOSIS — Z136 Encounter for screening for cardiovascular disorders: Secondary | ICD-10-CM | POA: Diagnosis not present

## 2023-10-19 DIAGNOSIS — Z113 Encounter for screening for infections with a predominantly sexual mode of transmission: Secondary | ICD-10-CM
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
## 2023-10-19 NOTE — Patient Instructions (Addendum)
 Please call to schedule your mammogram at [facility]:  Address: [address] [place], [place] [address]  Phone: [phone]

## 2023-10-20 ENCOUNTER — Encounter: Payer: Self-pay | Admitting: Nurse Practitioner

## 2023-10-20 ENCOUNTER — Ambulatory Visit: Payer: Self-pay | Admitting: Nurse Practitioner

## 2023-10-20 LAB — COMPREHENSIVE METABOLIC PANEL WITH GFR
ALT: 13 IU/L (ref 0–32)
AST: 18 IU/L (ref 0–40)
Albumin: 4.4 g/dL (ref 3.9–4.9)
Alkaline Phosphatase: 58 IU/L (ref 41–116)
BUN/Creatinine Ratio: 16 (ref 9–23)
BUN: 12 mg/dL (ref 6–24)
Bilirubin Total: 0.5 mg/dL (ref 0.0–1.2)
CO2: 24 mmol/L (ref 20–29)
Calcium: 9.6 mg/dL (ref 8.7–10.2)
Chloride: 104 mmol/L (ref 96–106)
Creatinine, Ser: 0.77 mg/dL (ref 0.57–1.00)
Globulin, Total: 2.7 g/dL (ref 1.5–4.5)
Glucose: 90 mg/dL (ref 70–99)
Potassium: 4.4 mmol/L (ref 3.5–5.2)
Sodium: 139 mmol/L (ref 134–144)
Total Protein: 7.1 g/dL (ref 6.0–8.5)
eGFR: 100 mL/min/1.73 (ref >59)

## 2023-10-20 LAB — CBC WITH DIFFERENTIAL/PLATELET
Basophils Absolute: 0.1 x10E3/uL (ref 0.0–0.2)
Basos: 1 %
EOS (ABSOLUTE): 0.1 x10E3/uL (ref 0.0–0.4)
Eos: 1 %
Hematocrit: 44.3 % (ref 34.0–46.6)
Hemoglobin: 14.6 g/dL (ref 11.1–15.9)
Immature Grans (Abs): 0 x10E3/uL (ref 0.0–0.1)
Immature Granulocytes: 0 %
Lymphocytes Absolute: 1.8 x10E3/uL (ref 0.7–3.1)
Lymphs: 28 %
MCH: 30.1 pg (ref 26.6–33.0)
MCHC: 33 g/dL (ref 31.5–35.7)
MCV: 91 fL (ref 79–97)
Monocytes Absolute: 0.5 x10E3/uL (ref 0.1–0.9)
Monocytes: 8 %
Neutrophils Absolute: 4.1 x10E3/uL (ref 1.4–7.0)
Neutrophils: 62 %
Platelets: 255 x10E3/uL (ref 150–450)
RBC: 4.85 x10E6/uL (ref 3.77–5.28)
RDW: 12.6 % (ref 11.7–15.4)
WBC: 6.6 x10E3/uL (ref 3.4–10.8)

## 2023-10-20 LAB — LIPID PANEL
Chol/HDL Ratio: 2.4 ratio (ref 0.0–4.4)
Cholesterol, Total: 165 mg/dL (ref 100–199)
HDL: 68 mg/dL (ref >39)
LDL Chol Calc (NIH): 83 mg/dL (ref 0–99)
Triglycerides: 74 mg/dL (ref 0–149)
VLDL Cholesterol Cal: 14 mg/dL (ref 5–40)

## 2023-10-20 LAB — RPR: RPR Ser Ql: NONREACTIVE

## 2023-10-20 LAB — HIV ANTIBODY (ROUTINE TESTING W REFLEX): HIV Screen 4th Generation wRfx: NONREACTIVE

## 2023-10-20 LAB — TSH: TSH: 0.836 u[IU]/mL (ref 0.450–4.500)

## 2023-10-20 LAB — HEPATITIS C ANTIBODY: Hep C Virus Ab: NONREACTIVE

## 2023-10-21 LAB — CHLAMYDIA/GONOCOCCUS/TRICHOMONAS, NAA
Chlamydia by NAA: NEGATIVE
Gonococcus by NAA: NEGATIVE
Trich vag by NAA: NEGATIVE

## 2024-10-20 ENCOUNTER — Encounter: Admitting: Nurse Practitioner
